# Patient Record
Sex: Male | Born: 1975 | Race: White | Hispanic: No | Marital: Married | Smoking: Current some day smoker
Health system: Southern US, Community
[De-identification: ages and names within clinical notes are randomized; demographics above are authoritative.]

## PROBLEM LIST (undated history)

## (undated) HISTORY — PX: CORNEAL TRANSPLANT: SHX108

---

## 2013-03-19 DIAGNOSIS — Z947 Corneal transplant status: Secondary | ICD-10-CM | POA: Insufficient documentation

## 2014-08-13 ENCOUNTER — Other Ambulatory Visit: Payer: Self-pay | Admitting: Internal Medicine

## 2014-08-13 DIAGNOSIS — M5412 Radiculopathy, cervical region: Secondary | ICD-10-CM

## 2014-08-17 ENCOUNTER — Ambulatory Visit
Admission: RE | Admit: 2014-08-17 | Discharge: 2014-08-17 | Disposition: A | Discharge status: Home / Self Care | Payer: 59 | Source: Ambulatory Visit | Attending: Internal Medicine | Admitting: Internal Medicine

## 2014-08-17 DIAGNOSIS — M5412 Radiculopathy, cervical region: Secondary | ICD-10-CM

## 2015-12-10 ENCOUNTER — Ambulatory Visit: Payer: Self-pay

## 2015-12-10 ENCOUNTER — Other Ambulatory Visit: Payer: Self-pay | Admitting: Occupational Medicine

## 2015-12-10 DIAGNOSIS — Z0000 Encounter for general adult medical examination without abnormal findings: Secondary | ICD-10-CM

## 2016-09-05 IMAGING — MR MR CERVICAL SPINE W/O CM
4 of 5 series · 19 of 48 positions shown · non-contrast
Comparison: None.

CLINICAL DATA: Cervical radiculopathy. Pain weakness and numbness
right arm

EXAM:
MRI CERVICAL SPINE WITHOUT CONTRAST
TECHNIQUE: Multiplanar, multisequence MR imaging of the cervical spine was
performed. No intravenous contrast was administered.

[Series 2: T2 · sagittal · 3.0mm · 0.39mm/px · 7 of 12 slices shown (1 of 2)]
[im 1/12]
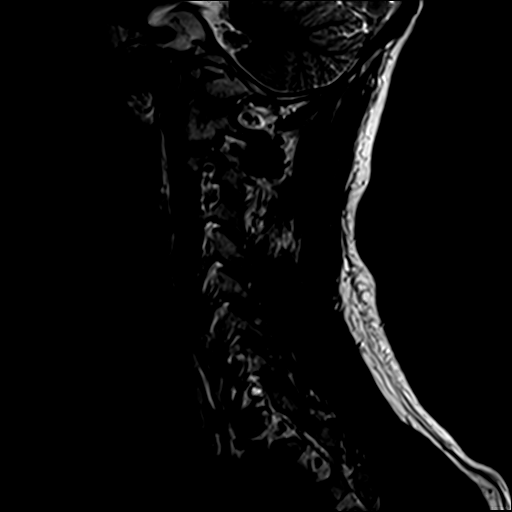
[im 2/12]
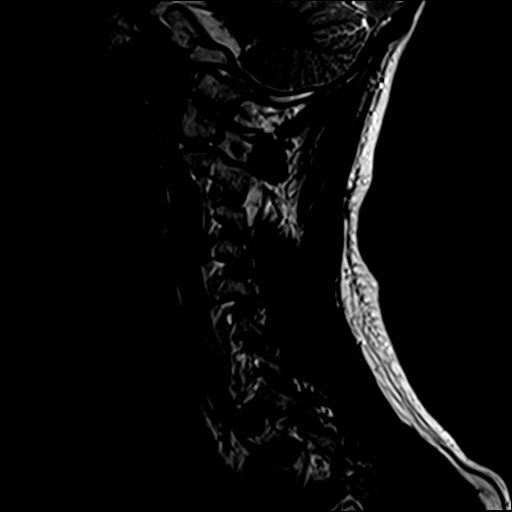
[im 4/12]
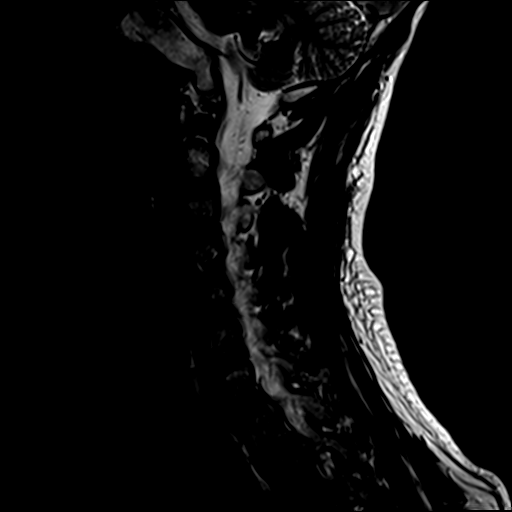
[im 6/12]
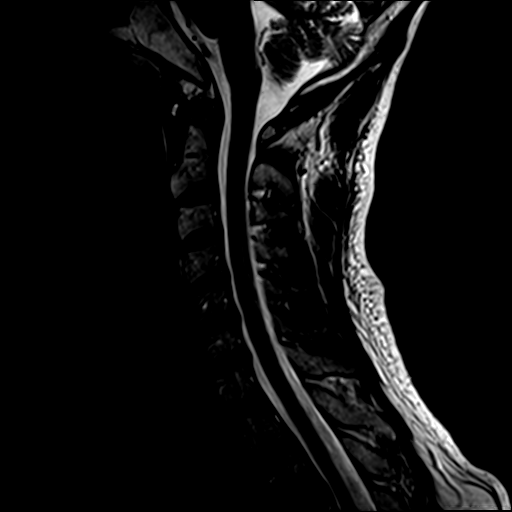
[im 8/12]
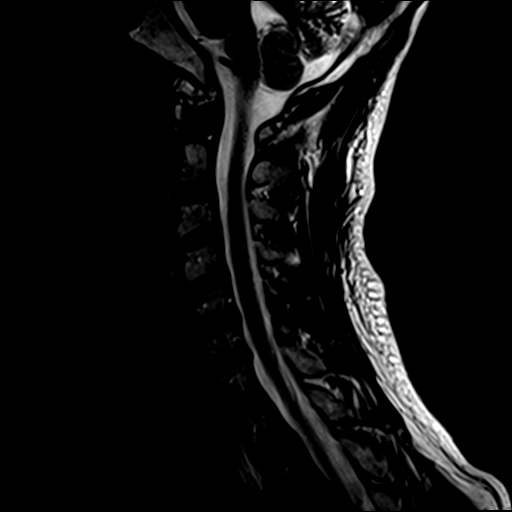
[im 10/12]
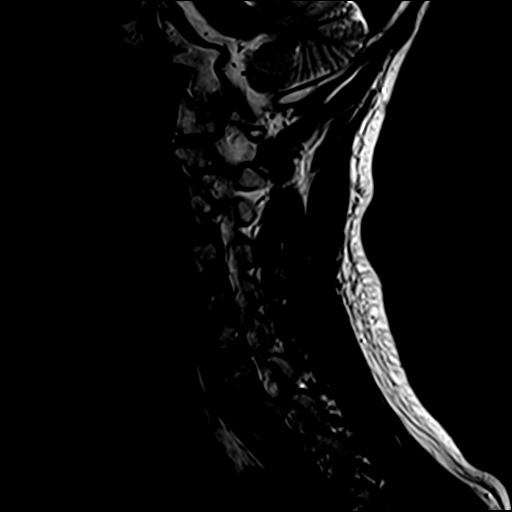
[im 12/12]
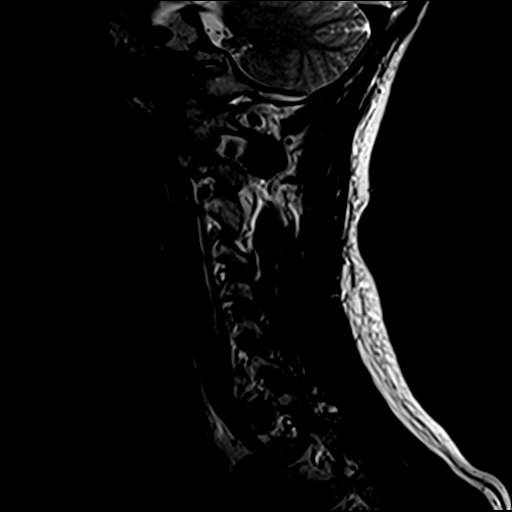

[Series 3: T1 · sagittal · 3.0mm · 0.39mm/px · 3 of 12 slices shown]
[im 2/12]
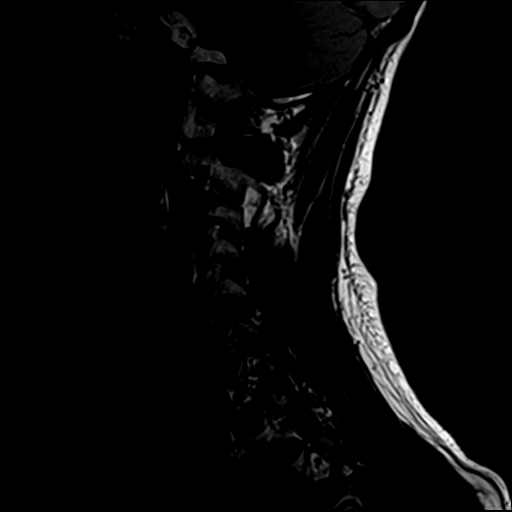
[im 6/12]
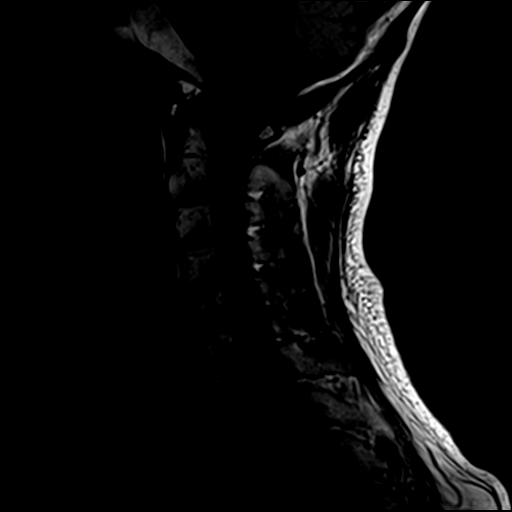
[im 10/12]
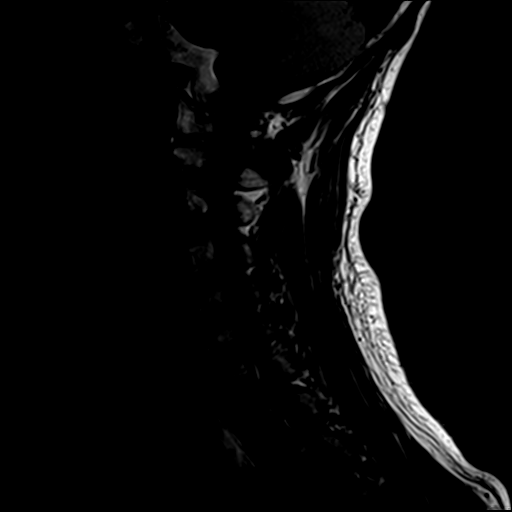

[Series 4: T2 · axial · 3.0mm · 0.33mm/px · z∈[-92,-10]mm · 6 of 27 slices shown (2 of 2)]
[im 1/27]
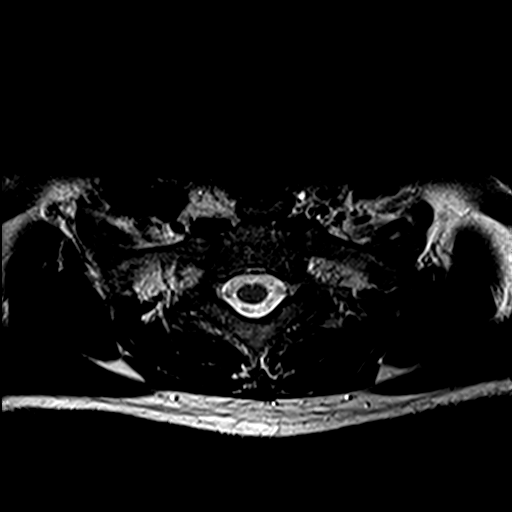
[im 5/27]
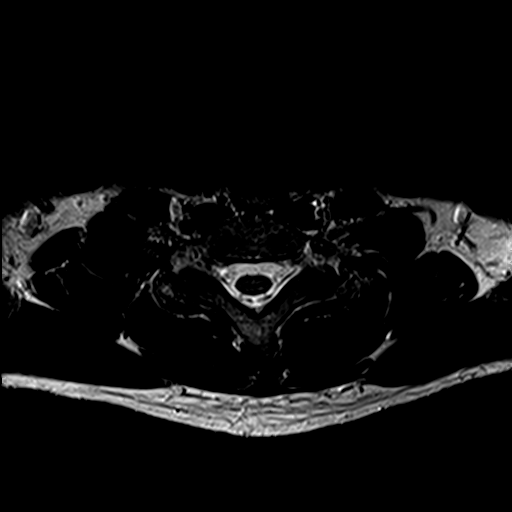
[im 9/27]
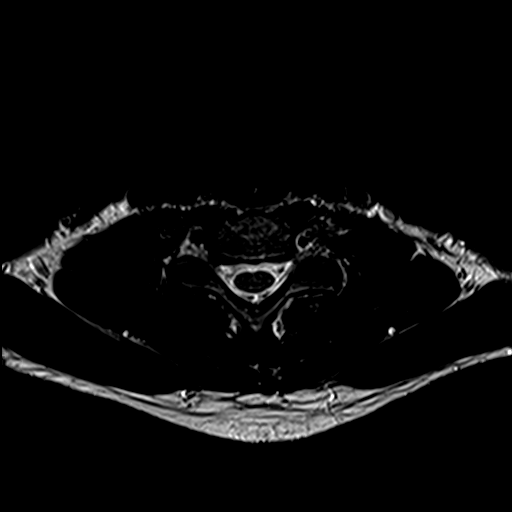
[im 13/27]
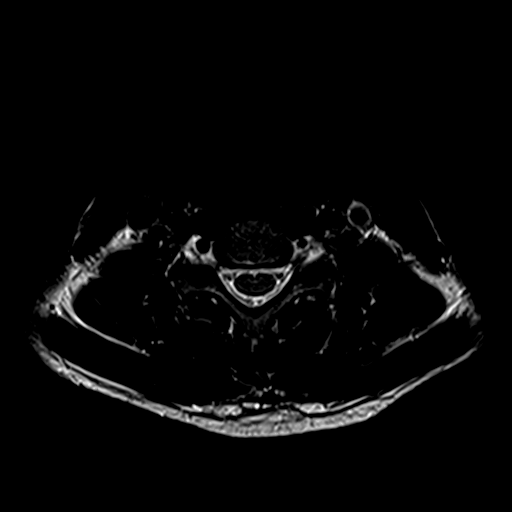
[im 15/27]
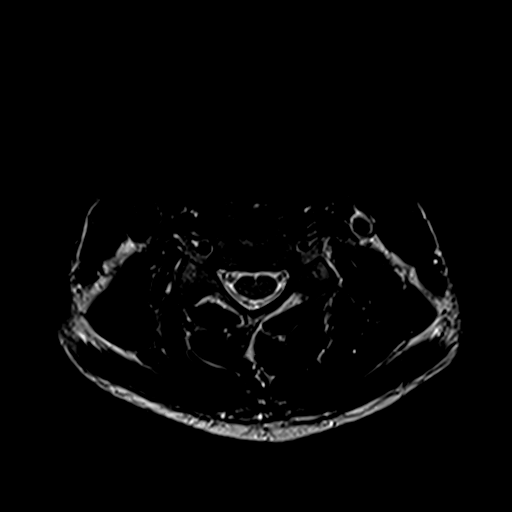
[im 23/27]
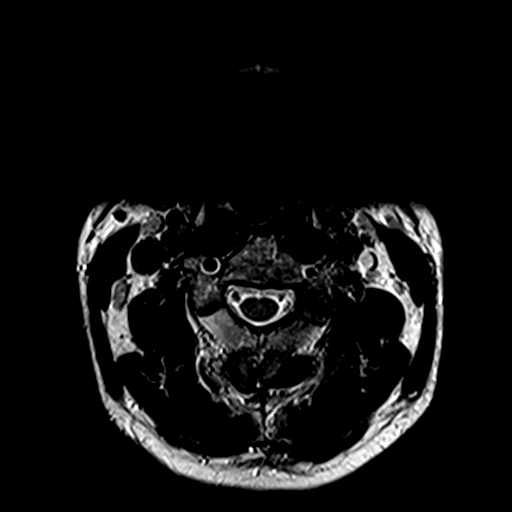

[Series 5: STIR · sagittal · 3.0mm · 0.39mm/px · 3 of 12 slices shown]
[im 3/12]
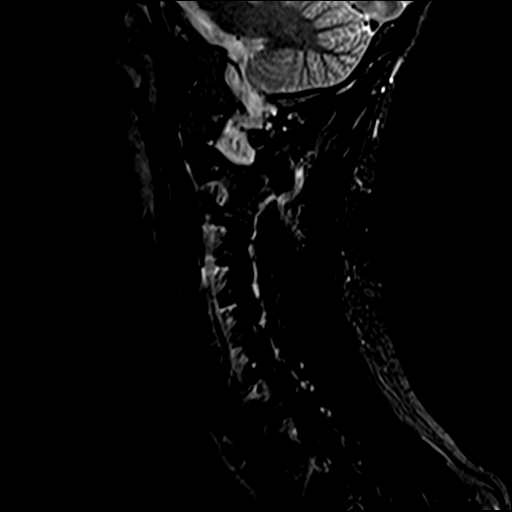
[im 7/12]
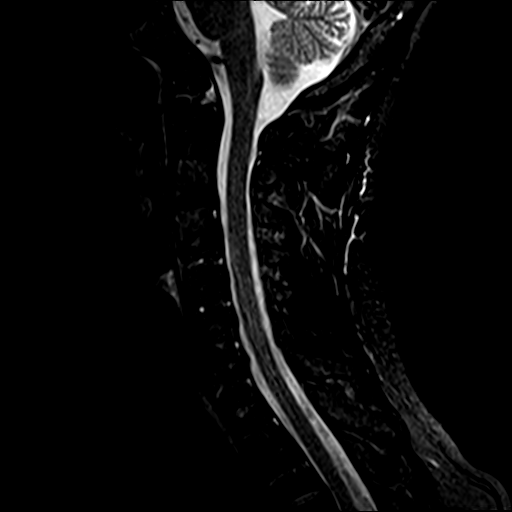
[im 12/12]
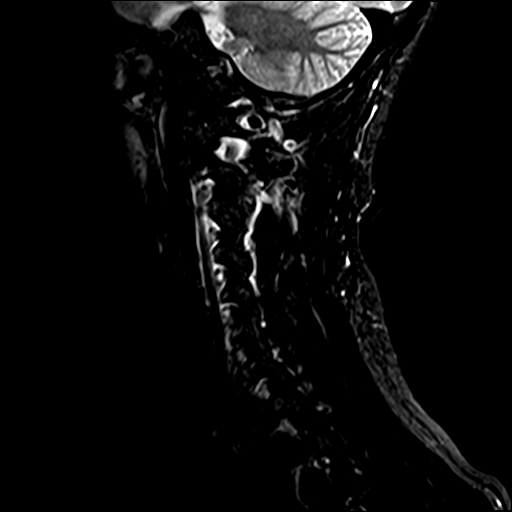

[19 of 48 positions shown; findings below may reference images not displayed]

FINDINGS: Normal cervical alignment. Negative for fracture or mass. Spinal
cord signal is normal. No cord lesion or cord compression.
Craniocervical junction is normal.

C2-3:  Negative

C3-4:  Negative

C4-5:  Negative

C5-6: Mild disc and mild facet degeneration. Mild foraminal
narrowing bilaterally.

C6-7: Disc degeneration with disc bulging and mild uncinate
spurring. Right foraminal encroachment due to disc and osteophyte
with compression of the right C7 nerve root. Mild facet hypertrophy.
Spinal canal normal in size

C7-T1: Negative
IMPRESSION: Mild foraminal stenosis bilaterally at C5-6 due to spurring

Significant right foraminal encroachment C6-7 due to disc protrusion
and osteophyte with C7 nerve root impingement.

## 2022-03-02 ENCOUNTER — Ambulatory Visit
Admission: RE | Admit: 2022-03-02 | Discharge: 2022-03-02 | Disposition: A | Discharge status: Home / Self Care | Payer: Self-pay | Source: Ambulatory Visit | Attending: Family Medicine | Admitting: Family Medicine

## 2022-03-02 ENCOUNTER — Other Ambulatory Visit: Payer: Self-pay | Admitting: Family Medicine

## 2022-03-02 DIAGNOSIS — Z0289 Encounter for other administrative examinations: Secondary | ICD-10-CM

## 2022-03-25 ENCOUNTER — Ambulatory Visit: Payer: 59 | Admitting: Family Medicine

## 2022-03-25 ENCOUNTER — Encounter: Payer: Self-pay | Admitting: Family Medicine

## 2022-03-25 VITALS — BP 117/71 | HR 76 | Temp 97.6°F | Ht 69.0 in | Wt 186.2 lb

## 2022-03-25 DIAGNOSIS — F411 Generalized anxiety disorder: Secondary | ICD-10-CM

## 2022-03-25 DIAGNOSIS — E782 Mixed hyperlipidemia: Secondary | ICD-10-CM | POA: Diagnosis not present

## 2022-03-25 DIAGNOSIS — Z23 Encounter for immunization: Secondary | ICD-10-CM

## 2022-03-25 MED ORDER — BUSPIRONE HCL 10 MG PO TABS: 10.0000 mg | ORAL_TABLET | Freq: Two times a day (BID) | ORAL | 2 refills | Status: DC

## 2022-03-25 MED ORDER — PREDNISOLONE ACETATE 1 % OP SUSP: 1.0000 [drp] | Freq: Every day | OPHTHALMIC | 11 refills | Status: DC

## 2022-03-25 NOTE — Patient Instructions (Signed)
Insomnia Insomnia is a sleep disorder that makes it difficult to fall asleep or stay asleep. Insomnia can cause fatigue, low energy, difficulty concentrating, mood swings, and poor performance at work or school. There are three different ways to classify insomnia: Difficulty falling asleep. Difficulty staying asleep. Waking up too early in the morning. Any type of insomnia can be long-term (chronic) or short-term (acute). Both are common. Short-term insomnia usually lasts for 3 months or less. Chronic insomnia occurs at least three times a week for longer than 3 months. What are the causes? Insomnia may be caused by another condition, situation, or substance, such as: Having certain mental health conditions, such as anxiety and depression. Using caffeine, alcohol, tobacco, or drugs. Having gastrointestinal conditions, such as gastroesophageal reflux disease (GERD). Having certain medical conditions. These include: Asthma. Alzheimer's disease. Stroke. Chronic pain. An overactive thyroid gland (hyperthyroidism). Other sleep disorders, such as restless legs syndrome and sleep apnea. Menopause. Sometimes, the cause of insomnia may not be known. What increases the risk? Risk factors for insomnia include: Gender. Females are affected more often than males. Age. Insomnia is more common as people get older. Stress and certain medical and mental health conditions. Lack of exercise. Having an irregular work schedule. This may include working night shifts and traveling between different time zones. What are the signs or symptoms? If you have insomnia, the main symptom is having trouble falling asleep or having trouble staying asleep. This may lead to other symptoms, such as: Feeling tired or having low energy. Feeling nervous about going to sleep. Not feeling rested in the morning. Having trouble concentrating. Feeling irritable, anxious, or depressed. How is this diagnosed? This condition  may be diagnosed based on: Your symptoms and medical history. Your health care provider may ask about: Your sleep habits. Any medical conditions you have. Your mental health. A physical exam. How is this treated? Treatment for insomnia depends on the cause. Treatment may focus on treating an underlying condition that is causing the insomnia. Treatment may also include: Medicines to help you sleep. Counseling or therapy. Lifestyle adjustments to help you sleep better. Follow these instructions at home: Eating and drinking  Limit or avoid alcohol, caffeinated beverages, and products that contain nicotine and tobacco, especially close to bedtime. These can disrupt your sleep. Do not eat a large meal or eat spicy foods right before bedtime. This can lead to digestive discomfort that can make it hard for you to sleep. Sleep habits  Keep a sleep diary to help you and your health care provider figure out what could be causing your insomnia. Write down: When you sleep. When you wake up during the night. How well you sleep and how rested you feel the next day. Any side effects of medicines you are taking. What you eat and drink. Make your bedroom a dark, comfortable place where it is easy to fall asleep. Put up shades or blackout curtains to block light from outside. Use a white noise machine to block noise. Keep the temperature cool. Limit screen use before bedtime. This includes: Not watching TV. Not using your smartphone, tablet, or computer. Stick to a routine that includes going to bed and waking up at the same times every day and night. This can help you fall asleep faster. Consider making a quiet activity, such as reading, part of your nighttime routine. Try to avoid taking naps during the day so that you sleep better at night. Get out of bed if you are still awake after   15 minutes of trying to sleep. Keep the lights down, but try reading or doing a quiet activity. When you feel  sleepy, go back to bed. General instructions Take over-the-counter and prescription medicines only as told by your health care provider. Exercise regularly as told by your health care provider. However, avoid exercising in the hours right before bedtime. Use relaxation techniques to manage stress. Ask your health care provider to suggest some techniques that may work well for you. These may include: Breathing exercises. Routines to release muscle tension. Visualizing peaceful scenes. Make sure that you drive carefully. Do not drive if you feel very sleepy. Keep all follow-up visits. This is important. Contact a health care provider if: You are tired throughout the day. You have trouble in your daily routine due to sleepiness. You continue to have sleep problems, or your sleep problems get worse. Get help right away if: You have thoughts about hurting yourself or someone else. Get help right away if you feel like you may hurt yourself or others, or have thoughts about taking your own life. Go to your nearest emergency room or: Call 911. Call the National Suicide Prevention Lifeline at 1-800-273-8255 or 988. This is open 24 hours a day. Text the Crisis Text Line at 741741. Summary Insomnia is a sleep disorder that makes it difficult to fall asleep or stay asleep. Insomnia can be long-term (chronic) or short-term (acute). Treatment for insomnia depends on the cause. Treatment may focus on treating an underlying condition that is causing the insomnia. Keep a sleep diary to help you and your health care provider figure out what could be causing your insomnia. This information is not intended to replace advice given to you by your health care provider. Make sure you discuss any questions you have with your health care provider. Document Revised: 09/14/2021 Document Reviewed: 09/14/2021 Elsevier Patient Education  2023 [employer]  

## 2022-03-25 NOTE — Progress Notes (Unsigned)
Subjective:  Patient ID: Howard Oliver, male    DOB: 1976/10/08  Age: 46 y.o. MRN: [id number]  CC: Establish Care   HPI Howard Oliver presents for New patient evaluation. Followed for lipid disorder and anxiety.     03/25/2022    1:15 PM 03/25/2022    1:14 PM  GAD 7 : Generalized Anxiety Score  Nervous, Anxious, on Edge 2 1  Control/stop worrying 1 1  Worry too much - different things 2 0  Trouble relaxing 1 1  Restless 0 1  Easily annoyed or irritable 0 1  Afraid - awful might happen 0 0  Total GAD 7 Score 6 5  Anxiety Difficulty Not difficult at all Not difficult at all         03/25/2022    1:14 PM  Depression screen PHQ 2/9  Decreased Interest 0  Down, Depressed, Hopeless 0  PHQ - 2 Score 0  Altered sleeping 2  Tired, decreased energy 1  Change in appetite 0  Feeling bad or failure about yourself  0  Trouble concentrating 0  Moving slowly or fidgety/restless 0  Suicidal thoughts 0  PHQ-9 Score 3  Difficult doing work/chores Not difficult at all  Severe anxiety. Has gotten better. Still not sleeping well. Gets a knot in the stomach and starts sweating. Worries a lot. Works at [employer] and [employer]. Takes buspar.5 mg at night. Daytime use made him drowsy.   HAd corneal transplant for Keratoconus. OS, 10 years ago. Uses prednisolone 1% qhs  History Howard Oliver has no past medical history on file.   He has a past surgical history that includes Corneal transplant (Left).   His family history includes Cancer in his father and mother.He reports that he has been smoking e-cigarettes. He does not have any smokeless tobacco history on file. He reports current alcohol use of about 2.0 standard drinks of alcohol per week. He reports that he does not currently use drugs.    ROS Review of Systems  Constitutional: Negative.   HENT: Negative.    Eyes:  Negative for visual disturbance.  Respiratory:  Negative for cough and shortness of breath.   Cardiovascular:   Negative for chest pain and leg swelling.  Gastrointestinal:  Negative for abdominal pain, diarrhea, nausea and vomiting.  Genitourinary:  Negative for difficulty urinating.  Musculoskeletal:  Negative for arthralgias and myalgias.  Skin:  Negative for rash.  Neurological:  Negative for headaches.  Psychiatric/Behavioral:  Negative for sleep disturbance.     Objective:  BP 117/71   Pulse 76   Temp 97.6 F (36.4 C)   Ht 5\' 9"  (1.753 m)   Wt 186 lb 3.2 oz (84.5 kg)   SpO2 97%   BMI 27.50 kg/m   BP Readings from Last 3 Encounters:  03/25/22 117/71    Wt Readings from Last 3 Encounters:  03/25/22 186 lb 3.2 oz (84.5 kg)     Physical Exam Vitals reviewed.  Constitutional:      General: He is not in acute distress.    Appearance: He is well-developed.  HENT:     Head: Normocephalic and atraumatic.     Right Ear: External ear normal.     Left Ear: External ear normal.     Nose: Nose normal.     Mouth/Throat:     Pharynx: No oropharyngeal exudate or posterior oropharyngeal erythema.  Eyes:     Conjunctiva/sclera: Conjunctivae normal.     Pupils: Pupils are equal, round, and reactive  to light.  Cardiovascular:     Rate and Rhythm: Normal rate and regular rhythm.     Heart sounds: Normal heart sounds. No murmur heard. Pulmonary:     Effort: Pulmonary effort is normal. No respiratory distress.     Breath sounds: Normal breath sounds. No wheezing or rales.  Abdominal:     Palpations: Abdomen is soft.     Tenderness: There is no abdominal tenderness.  Musculoskeletal:        General: Normal range of motion.     Cervical back: Normal range of motion and neck supple.  Skin:    General: Skin is warm and dry.  Neurological:     Mental Status: He is alert and oriented to person, place, and time.     Deep Tendon Reflexes: Reflexes are normal and symmetric.  Psychiatric:        Behavior: Behavior normal.        Thought Content: Thought content normal.        Judgment:  Judgment normal.       Assessment & Plan:   Piers was seen today for establish care.  Diagnoses and all orders for this visit:  Mixed hyperlipidemia  GAD (generalized anxiety disorder)  Other orders -     prednisoLONE acetate (PRED FORTE) 1 % ophthalmic suspension; Place 1 drop into the left eye at bedtime. -     busPIRone (BUSPAR) 10 MG tablet; Take 1 tablet (10 mg total) by mouth 2 (two) times daily. -     Td : Tetanus/diphtheria >7yo Preservative  free    I am having Howard Oliver start on prednisoLONE acetate and busPIRone.  Allergies as of 03/25/2022   Not on File      Medication List        Accurate as of March 25, 2022 11:59 PM. If you have any questions, ask your nurse or doctor.          busPIRone 10 MG tablet Commonly known as: BUSPAR Take 1 tablet (10 mg total) by mouth 2 (two) times daily. Started by: Mechele Claude, MD   prednisoLONE acetate 1 % ophthalmic suspension Commonly known as: PRED FORTE Place 1 drop into the left eye at bedtime. Started by: Mechele Claude, MD         Follow-up: Return in about 6 months (around 09/24/2022) for Anxiety, cholesterol.  Mechele Claude, M.D.

## 2022-03-28 ENCOUNTER — Encounter: Payer: Self-pay | Admitting: Family Medicine

## 2022-04-16 ENCOUNTER — Other Ambulatory Visit: Payer: Self-pay | Admitting: Family Medicine

## 2022-07-28 ENCOUNTER — Other Ambulatory Visit: Payer: Self-pay | Admitting: Family Medicine

## 2022-08-25 ENCOUNTER — Encounter: Payer: Self-pay | Admitting: Family Medicine

## 2022-08-25 ENCOUNTER — Ambulatory Visit: Payer: 59 | Admitting: Family Medicine

## 2022-08-25 VITALS — BP 116/67 | HR 64 | Temp 97.2°F | Ht 69.0 in | Wt 186.4 lb

## 2022-08-25 DIAGNOSIS — N410 Acute prostatitis: Secondary | ICD-10-CM

## 2022-08-25 DIAGNOSIS — H18602 Keratoconus, unspecified, left eye: Secondary | ICD-10-CM | POA: Insufficient documentation

## 2022-08-25 LAB — URINALYSIS, COMPLETE
Bilirubin, UA: NEGATIVE
Glucose, UA: NEGATIVE
Ketones, UA: NEGATIVE
Leukocytes,UA: NEGATIVE
Nitrite, UA: NEGATIVE
Protein,UA: NEGATIVE
RBC, UA: NEGATIVE
Specific Gravity, UA: 1.025 (ref 1.005–1.030)
Urobilinogen, Ur: 0.2 mg/dL (ref 0.2–1.0)
pH, UA: 5.5 (ref 5.0–7.5)

## 2022-08-25 LAB — MICROSCOPIC EXAMINATION
RBC, Urine: NONE SEEN /hpf (ref 0–2)
Renal Epithel, UA: NONE SEEN /hpf
WBC, UA: NONE SEEN /hpf (ref 0–5)

## 2022-08-25 MED ORDER — CIPROFLOXACIN HCL 500 MG PO TABS: 500.0000 mg | ORAL_TABLET | Freq: Two times a day (BID) | ORAL | 0 refills | Status: DC

## 2022-08-25 NOTE — Progress Notes (Signed)
Chief Complaint  Patient presents with   Urinary Frequency    HPI  Patient presents today for urinary frequency. No dysuria. Onset 4-5 days ago. No urgency. Testicular discomfort. Some low back pain, but has been moving. No nausea, no fever  PMH: Smoking status noted ROS: Per HPI  Objective: BP 116/67   Pulse 64   Temp (!) 97.2 F (36.2 C)   Ht 5\' 9"  (1.753 m)   Wt 186 lb 6.4 oz (84.6 kg)   SpO2 97%   BMI 27.53 kg/m  Gen: NAD, alert, cooperative with exam HEENT: NCAT,  Abd: SNTND, prostate supple, boggy Ext: No edema, warm Neuro: Alert and oriented, No gross deficits  Assessment and plan:  1. Acute prostatitis     Meds ordered this encounter  Medications   ciprofloxacin (CIPRO) 500 MG tablet    Sig: Take 1 tablet (500 mg total) by mouth 2 (two) times daily. For prostate. Take all of these.    Dispense:  60 tablet    Refill:  0    Orders Placed This Encounter  Procedures   Urine Culture   Urinalysis, Complete    Follow up as needed.  , MD

## 2022-08-25 NOTE — Assessment & Plan Note (Signed)
Corneal transplant, uses steroid drops daily

## 2022-08-27 LAB — URINE CULTURE: Organism ID, Bacteria: NO GROWTH

## 2022-09-23 ENCOUNTER — Ambulatory Visit: Payer: 59 | Admitting: Family Medicine

## 2022-10-07 ENCOUNTER — Encounter: Payer: Self-pay | Admitting: Family Medicine

## 2022-10-07 ENCOUNTER — Ambulatory Visit: Payer: 59 | Admitting: Family Medicine

## 2022-10-07 VITALS — BP 113/62 | HR 63 | Temp 97.6°F | Ht 69.0 in | Wt 186.6 lb

## 2022-10-07 DIAGNOSIS — N410 Acute prostatitis: Secondary | ICD-10-CM | POA: Diagnosis not present

## 2022-10-07 DIAGNOSIS — M5441 Lumbago with sciatica, right side: Secondary | ICD-10-CM

## 2022-10-07 DIAGNOSIS — M5442 Lumbago with sciatica, left side: Secondary | ICD-10-CM | POA: Diagnosis not present

## 2022-10-07 MED ORDER — CIPROFLOXACIN HCL 500 MG PO TABS: 500.0000 mg | ORAL_TABLET | Freq: Two times a day (BID) | ORAL | 0 refills | Status: DC

## 2022-10-07 MED ORDER — BUSPIRONE HCL 10 MG PO TABS: 10.0000 mg | ORAL_TABLET | Freq: Two times a day (BID) | ORAL | 3 refills | Status: AC

## 2022-10-07 MED ORDER — DICLOFENAC SODIUM 75 MG PO TBEC: 75.0000 mg | DELAYED_RELEASE_TABLET | Freq: Two times a day (BID) | ORAL | 2 refills | Status: DC

## 2022-10-07 MED ORDER — PREDNISONE 10 MG PO TABS: ORAL_TABLET | ORAL | 0 refills | Status: DC

## 2022-10-07 NOTE — Progress Notes (Signed)
Subjective:  Patient ID: Howard Oliver, male    DOB: 08-20-1976  Age: 46 y.o. MRN: [id number]  CC: Medical Management of Chronic Issues   HPI Howard Oliver presents for frequent urination some days 3-4 times a day, other days 6-7 times . Up once at night. with pressure pain at groin. No urgency. Decrease of testicular discomfort. Pain at lower back and down posterior thighs and buttocks     10/07/2022    8:21 AM 08/25/2022    8:34 AM 03/25/2022    1:14 PM  Depression screen PHQ 2/9  Decreased Interest 0 0 0  Down, Depressed, Hopeless 0 0 0  PHQ - 2 Score 0 0 0  Altered sleeping 2  2  Tired, decreased energy 1  1  Change in appetite 0  0  Feeling bad or failure about yourself  0  0  Trouble concentrating 0  0  Moving slowly or fidgety/restless 0  0  Suicidal thoughts 0  0  PHQ-9 Score 3  3  Difficult doing work/chores Not difficult at all  Not difficult at all    History Howard Oliver has no past medical history on file.   Howard Oliver has a past surgical history that includes Corneal transplant (Left).   His family history includes Cancer in his father and mother.Howard Oliver reports that Howard Oliver has been smoking e-cigarettes. Howard Oliver does not have any smokeless tobacco history on file. Howard Oliver reports current alcohol use of about 2.0 standard drinks of alcohol per week. Howard Oliver reports that Howard Oliver does not currently use drugs.    ROS Review of Systems  Constitutional:  Negative for chills, diaphoresis and fever.  HENT:  Negative for sore throat.   Respiratory:  Negative for shortness of breath.   Cardiovascular:  Negative for chest pain.  Gastrointestinal:  Negative for abdominal pain.  Musculoskeletal:  Positive for back pain (radiates bilat. to buttocks & thighs) and myalgias. Negative for neck pain.  Skin:  Negative for rash.  Neurological:  Negative for numbness.    Objective:  BP 113/62   Pulse 63   Temp 97.6 F (36.4 C)   Ht 5\' 9"  (1.753 m)   Wt 186 lb 9.6 oz (84.6 kg)   SpO2 97%   BMI 27.56 kg/m    BP Readings from Last 3 Encounters:  10/07/22 113/62  08/25/22 116/67  03/25/22 117/71    Wt Readings from Last 3 Encounters:  10/07/22 186 lb 9.6 oz (84.6 kg)  08/25/22 186 lb 6.4 oz (84.6 kg)  03/25/22 186 lb 3.2 oz (84.5 kg)     Physical Exam Vitals reviewed.  Constitutional:      Appearance: Howard Oliver is well-developed.  HENT:     Head: Normocephalic and atraumatic.     Right Ear: External ear normal.     Left Ear: External ear normal.     Mouth/Throat:     Pharynx: No oropharyngeal exudate or posterior oropharyngeal erythema.  Eyes:     Pupils: Pupils are equal, round, and reactive to light.  Cardiovascular:     Rate and Rhythm: Normal rate and regular rhythm.     Heart sounds: No murmur heard. Pulmonary:     Effort: No respiratory distress.     Breath sounds: Normal breath sounds.  Genitourinary:    Comments: Prostate boggy, no masses Musculoskeletal:        General: Tenderness (mild at region of L4-5 bilaterally) present.     Cervical back: Normal range of motion and neck supple.  Neurological:  Mental Status: Howard Oliver is alert and oriented to person, place, and time.       Assessment & Plan:   Howard Oliver was seen today for medical management of chronic issues.  Diagnoses and all orders for this visit:  Acute prostatitis  Acute bilateral low back pain with bilateral sciatica  Other orders -     busPIRone (BUSPAR) 10 MG tablet; Take 1 tablet (10 mg total) by mouth 2 (two) times daily. -     diclofenac (VOLTAREN) 75 MG EC tablet; Take 1 tablet (75 mg total) by mouth 2 (two) times daily. For muscle and  Joint pain -     predniSONE (DELTASONE) 10 MG tablet; Take 5 daily for 2 days followed by 4,3,2 and 1 for 2 days each. -     ciprofloxacin (CIPRO) 500 MG tablet; Take 1 tablet (500 mg total) by mouth 2 (two) times daily. For prostate. Take all of these.       I have changed Howard Oliver's busPIRone. I am also having him start on diclofenac and predniSONE.  Additionally, I am having him maintain his prednisoLONE acetate and ciprofloxacin.  Allergies as of 10/07/2022   No Known Allergies      Medication List        Accurate as of October 07, 2022  8:59 AM. If you have any questions, ask your nurse or doctor.          busPIRone 10 MG tablet Commonly known as: BUSPAR Take 1 tablet (10 mg total) by mouth 2 (two) times daily.   ciprofloxacin 500 MG tablet Commonly known as: Cipro Take 1 tablet (500 mg total) by mouth 2 (two) times daily. For prostate. Take all of these.   diclofenac 75 MG EC tablet Commonly known as: VOLTAREN Take 1 tablet (75 mg total) by mouth 2 (two) times daily. For muscle and  Joint pain Started by: Mechele Claude, MD   prednisoLONE acetate 1 % ophthalmic suspension Commonly known as: PRED FORTE Place 1 drop into the left eye at bedtime.   predniSONE 10 MG tablet Commonly known as: DELTASONE Take 5 daily for 2 days followed by 4,3,2 and 1 for 2 days each. Started by: Mechele Claude, MD         Follow-up: If not better in 1 month will need to see urology. Pt. Will call to let me know.  Mechele Claude, M.D.

## 2022-10-14 ENCOUNTER — Telehealth: Payer: Self-pay | Admitting: Family Medicine

## 2022-10-14 DIAGNOSIS — R3989 Other symptoms and signs involving the genitourinary system: Secondary | ICD-10-CM

## 2022-10-14 DIAGNOSIS — R399 Unspecified symptoms and signs involving the genitourinary system: Secondary | ICD-10-CM

## 2022-10-14 NOTE — Telephone Encounter (Signed)
Pt called to let Dr Darlyn Read know that his symptoms are still no better after taking 2 antibiotics and wants Dr Darlyn Read to go ahead and place referral for him to see Urologist.

## 2022-10-14 NOTE — Telephone Encounter (Signed)
Referral placed.

## 2022-11-16 ENCOUNTER — Encounter: Payer: Self-pay | Admitting: Urology

## 2022-11-16 ENCOUNTER — Ambulatory Visit: Payer: 59 | Admitting: Urology

## 2022-11-16 VITALS — BP 136/87 | HR 72 | Ht 69.0 in | Wt 186.0 lb

## 2022-11-16 DIAGNOSIS — N411 Chronic prostatitis: Secondary | ICD-10-CM | POA: Diagnosis not present

## 2022-11-16 DIAGNOSIS — N138 Other obstructive and reflux uropathy: Secondary | ICD-10-CM

## 2022-11-16 DIAGNOSIS — R350 Frequency of micturition: Secondary | ICD-10-CM | POA: Diagnosis not present

## 2022-11-16 DIAGNOSIS — N401 Enlarged prostate with lower urinary tract symptoms: Secondary | ICD-10-CM

## 2022-11-16 LAB — BLADDER SCAN AMB NON-IMAGING: Scan Result: 0

## 2022-11-16 MED ORDER — SULFAMETHOXAZOLE-TRIMETHOPRIM 800-160 MG PO TABS: 1.0000 | ORAL_TABLET | Freq: Two times a day (BID) | ORAL | 0 refills | Status: DC

## 2022-11-16 MED ORDER — TAMSULOSIN HCL 0.4 MG PO CAPS: 0.4000 mg | ORAL_CAPSULE | Freq: Every day | ORAL | 11 refills | Status: DC

## 2022-11-16 NOTE — Progress Notes (Signed)
post void residual = 0 ml

## 2022-11-16 NOTE — Patient Instructions (Signed)

## 2022-11-16 NOTE — Progress Notes (Signed)
11/16/2022 3:13 PM   Howard Oliver 10-28-1975 [id number]  Referring provider: Gwenlyn Oliver, [place],  [place] [address]  Urinary frequency   HPI: Howard Oliver is a 47yo here for evaluation for urinary frequency. He was diagnosed with prostatitis 2 months ago and was treated for 2 months with cipro. IPSS 14 QOL 5 on no BPh therapy. He notes slight improvement on the antibiotics. He has intermittent dysuria. His urine frequency is every 2 hours. Urine stream is strong but he does have urinary hesitancy. All of his LUTS started 2 months ago.    PMH: No past medical history on file.  Surgical History: Past Surgical History:  Procedure Laterality Date   CORNEAL TRANSPLANT Left     Home Medications:  Allergies as of 11/16/2022   No Known Allergies      Medication List        Accurate as of November 16, 2022  3:13 PM. If you have any questions, ask your nurse or doctor.          busPIRone 10 MG tablet Commonly known as: BUSPAR Take 1 tablet (10 mg total) by mouth 2 (two) times daily.   ciprofloxacin 500 MG tablet Commonly known as: Cipro Take 1 tablet (500 mg total) by mouth 2 (two) times daily. For prostate. Take all of these.   diclofenac 75 MG EC tablet Commonly known as: VOLTAREN Take 1 tablet (75 mg total) by mouth 2 (two) times daily. For muscle and  Joint pain   prednisoLONE acetate 1 % ophthalmic suspension Commonly known as: PRED FORTE Place 1 drop into the left eye at bedtime.   predniSONE 10 MG tablet Commonly known as: DELTASONE Take 5 daily for 2 days followed by 4,3,2 and 1 for 2 days each.        Allergies: No Known Allergies  Family History: Family History  Problem Relation Age of Onset   Cancer Mother    Cancer Father     Social History:  reports that he has been smoking e-cigarettes. He does not have any smokeless tobacco history on file. He reports current alcohol use of about 2.0 standard drinks of alcohol per  week. He reports that he does not currently use drugs.  ROS: All other review of systems were reviewed and are negative except what is noted above in HPI  Physical Exam: BP 136/87   Pulse 72   Ht 5\' 9"  (1.753 m)   Wt 186 lb (84.4 kg)   BMI 27.47 kg/m   Constitutional:  Alert and oriented, No acute distress. HEENT: [place] AT, moist mucus membranes.  Trachea midline, no masses. Cardiovascular: No clubbing, cyanosis, or edema. Respiratory: Normal respiratory effort, no increased work of breathing. GI: Abdomen is soft, nontender, nondistended, no abdominal masses GU: No CVA tenderness. Circumcised phallus. No masses/lesions on penis, testis, scrotum. Prostate 30g smooth no nodules no induration.  Lymph: No cervical or inguinal lymphadenopathy. Skin: No rashes, bruises or suspicious lesions. Neurologic: Grossly intact, no focal deficits, moving all 4 extremities. Psychiatric: Normal mood and affect.  Laboratory Data: No results found for: "WBC", "HGB", "HCT", "MCV", "PLT"  No results found for: "CREATININE"  No results found for: "PSA"  No results found for: "TESTOSTERONE"  No results found for: "HGBA1C"  Urinalysis    Component Value Date/Time   APPEARANCEUR Clear 08/25/2022 0834   GLUCOSEU Negative 08/25/2022 0834   BILIRUBINUR Negative 08/25/2022 0834   PROTEINUR Negative 08/25/2022 0834   NITRITE Negative  08/25/2022 0834   LEUKOCYTESUR Negative 08/25/2022 0834    Lab Results  Component Value Date   LABMICR See below: 08/25/2022   WBCUA None seen 08/25/2022   LABEPIT 0-10 08/25/2022   BACTERIA Few (A) 08/25/2022    Pertinent Imaging:  No results found for this or any previous visit.  No results found for this or any previous visit.  No results found for this or any previous visit.  No results found for this or any previous visit.  No results found for this or any previous visit.  No valid procedures specified. No results found for this or any previous  visit.  No results found for this or any previous visit.   Assessment & Plan:    1. BPH with obstruction/lower urinary tract symptoms -We will start flona x0.4mg  daily - Urinalysis, Routine w reflex microscopic - BLADDER SCAN AMB NON-IMAGING  2. 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 Urinary frequency -flomax 0.4mg  daily    No follow-ups on file.  Howard Bang, MD  [facility] [place]

## 2022-11-17 LAB — URINALYSIS, ROUTINE W REFLEX MICROSCOPIC
Bilirubin, UA: NEGATIVE
Glucose, UA: NEGATIVE
Ketones, UA: NEGATIVE
Leukocytes,UA: NEGATIVE
Nitrite, UA: NEGATIVE
Protein,UA: NEGATIVE
RBC, UA: NEGATIVE
Specific Gravity, UA: 1.025 (ref 1.005–1.030)
Urobilinogen, Ur: 0.2 mg/dL (ref 0.2–1.0)
pH, UA: 5.5 (ref 5.0–7.5)

## 2022-12-27 ENCOUNTER — Ambulatory Visit: Payer: 59 | Admitting: Urology

## 2022-12-27 VITALS — BP 120/67 | HR 61

## 2022-12-27 DIAGNOSIS — R350 Frequency of micturition: Secondary | ICD-10-CM | POA: Diagnosis not present

## 2022-12-27 DIAGNOSIS — N411 Chronic prostatitis: Secondary | ICD-10-CM | POA: Diagnosis not present

## 2022-12-27 DIAGNOSIS — N138 Other obstructive and reflux uropathy: Secondary | ICD-10-CM

## 2022-12-27 MED ORDER — DOXYCYCLINE HYCLATE 100 MG PO CAPS: 100.0000 mg | ORAL_CAPSULE | Freq: Two times a day (BID) | ORAL | 0 refills | Status: DC

## 2022-12-27 MED ORDER — MIRABEGRON ER 25 MG PO TB24: 25.0000 mg | ORAL_TABLET | Freq: Every day | ORAL | 0 refills | Status: DC

## 2022-12-27 NOTE — Progress Notes (Unsigned)
12/27/2022 4:13 PM   Howard Oliver Feb 15, 1976 [id number]  Referring provider: Claretta Fraise, MD [place],  [place] [address]  No chief complaint on file.   HPI:    PMH: No past medical history on file.  Surgical History: Past Surgical History:  Procedure Laterality Date   CORNEAL TRANSPLANT Left     Home Medications:  Allergies as of 12/27/2022   No Known Allergies      Medication List        Accurate as of December 27, 2022  4:13 PM. If you have any questions, ask your nurse or doctor.          busPIRone 10 MG tablet Commonly known as: BUSPAR Take 1 tablet (10 mg total) by mouth 2 (two) times daily.   ciprofloxacin 500 MG tablet Commonly known as: Cipro Take 1 tablet (500 mg total) by mouth 2 (two) times daily. For prostate. Take all of these.   diclofenac 75 MG EC tablet Commonly known as: VOLTAREN Take 1 tablet (75 mg total) by mouth 2 (two) times daily. For muscle and  Joint pain   prednisoLONE acetate 1 % ophthalmic suspension Commonly known as: PRED FORTE Place 1 drop into the left eye at bedtime.   predniSONE 10 MG tablet Commonly known as: DELTASONE Take 5 daily for 2 days followed by 4,3,2 and 1 for 2 days each.   sulfamethoxazole-trimethoprim 800-160 MG tablet Commonly known as: BACTRIM DS Take 1 tablet by mouth 2 (two) times daily.   tamsulosin 0.4 MG Caps capsule Commonly known as: FLOMAX Take 1 capsule (0.4 mg total) by mouth daily after supper.        Allergies: No Known Allergies  Family History: Family History  Problem Relation Age of Onset   Cancer Mother    Cancer Father     Social History:  reports that he has been smoking e-cigarettes. He does not have any smokeless tobacco history on file. He reports current alcohol use of about 2.0 standard drinks of alcohol per week. He reports that he does not currently use drugs.  ROS: All other review of systems were reviewed and are negative except what is noted  above in HPI  Physical Exam: BP 120/67   Pulse 61   Constitutional:  Alert and oriented, No acute distress. HEENT: [place] AT, moist mucus membranes.  Trachea midline, no masses. Cardiovascular: No clubbing, cyanosis, or edema. Respiratory: Normal respiratory effort, no increased work of breathing. GI: Abdomen is soft, nontender, nondistended, no abdominal masses GU: No CVA tenderness.  Lymph: No cervical or inguinal lymphadenopathy. Skin: No rashes, bruises or suspicious lesions. Neurologic: Grossly intact, no focal deficits, moving all 4 extremities. Psychiatric: Normal mood and affect.  Laboratory Data: No results found for: "WBC", "HGB", "HCT", "MCV", "PLT"  No results found for: "CREATININE"  No results found for: "PSA"  No results found for: "TESTOSTERONE"  No results found for: "HGBA1C"  Urinalysis    Component Value Date/Time   APPEARANCEUR Clear 11/16/2022 1424   GLUCOSEU Negative 11/16/2022 1424   BILIRUBINUR Negative 11/16/2022 1424   PROTEINUR Negative 11/16/2022 1424   NITRITE Negative 11/16/2022 1424   LEUKOCYTESUR Negative 11/16/2022 1424    Lab Results  Component Value Date   LABMICR Comment 11/16/2022   WBCUA None seen 08/25/2022   LABEPIT 0-10 08/25/2022   BACTERIA Few (A) 08/25/2022    Pertinent Imaging: *** No results found for this or any previous visit.  No results found for this or  any previous visit.  No results found for this or any previous visit.  No results found for this or any previous visit.  No results found for this or any previous visit.  No valid procedures specified. No results found for this or any previous visit.  No results found for this or any previous visit.   Assessment & Plan:    1. Urinary frequency -mirabegron '25mg'$  daily - Urinalysis, Routine w reflex microscopic  2. 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  Nicolette Bang, MD  [facility]  Urology [place]

## 2022-12-28 ENCOUNTER — Encounter: Payer: Self-pay | Admitting: Urology

## 2022-12-28 LAB — URINALYSIS, ROUTINE W REFLEX MICROSCOPIC
Bilirubin, UA: NEGATIVE
Glucose, UA: NEGATIVE
Ketones, UA: NEGATIVE
Leukocytes,UA: NEGATIVE
Nitrite, UA: NEGATIVE
Protein,UA: NEGATIVE
RBC, UA: NEGATIVE
Specific Gravity, UA: 1.03 (ref 1.005–1.030)
Urobilinogen, Ur: 0.2 mg/dL (ref 0.2–1.0)
pH, UA: 5.5 (ref 5.0–7.5)

## 2022-12-28 NOTE — Patient Instructions (Signed)

## 2022-12-30 ENCOUNTER — Ambulatory Visit: Payer: 59 | Admitting: Family Medicine

## 2022-12-30 ENCOUNTER — Encounter: Payer: Self-pay | Admitting: Family Medicine

## 2022-12-30 VITALS — BP 123/74 | HR 54 | Ht 69.0 in | Wt 191.0 lb

## 2022-12-30 DIAGNOSIS — J302 Other seasonal allergic rhinitis: Secondary | ICD-10-CM

## 2022-12-30 MED ORDER — FLUTICASONE PROPIONATE 50 MCG/ACT NA SUSP: 1.0000 | Freq: Two times a day (BID) | NASAL | 6 refills | Status: AC | PRN

## 2022-12-30 MED ORDER — CETIRIZINE HCL 10 MG PO TABS: 10.0000 mg | ORAL_TABLET | Freq: Every day | ORAL | 2 refills | Status: DC

## 2022-12-30 NOTE — Progress Notes (Signed)
BP 123/74   Pulse (!) 54   Ht '5\' 9"'$  (1.753 m)   Wt 191 lb (86.6 kg)   SpO2 98%   BMI 28.21 kg/m    Subjective:   Patient ID: Howard Oliver, male    DOB: [birth year], 47 y.o.   MRN: [id number]  HPI: Howard Oliver is a 47 y.o. male presenting on 12/30/2022 for Shortness of Breath and Sinus Problem   HPI Sinus congestion nasal congestion feeling short of breath because of that. He says he is using over the Claritin and not helped.  He is undergoing prostate issues treatment with the urologist and just started doxycycline 2 days ago.  He says that has not really helped his shortness of breath or congestion.  He says mostly he is short of breath because he gets so congested in the nasal region he has trouble catching his breath from that he cannot breathe through his nose.  He denies any fevers or chills or sick contacts.  Is been going on for 3 to 4 weeks just does not seem to be improving.  He does normally get allergies in the springtime.  Relevant past medical, surgical, family and social history reviewed and updated as indicated. Interim medical history since our last visit reviewed. Allergies and medications reviewed and updated.  Review of Systems  Constitutional:  Negative for chills and fever.  HENT:  Positive for congestion, postnasal drip, rhinorrhea, sinus pressure and sneezing. Negative for ear discharge, ear pain, sore throat and voice change.   Eyes:  Negative for pain, discharge, redness and visual disturbance.  Respiratory:  Positive for cough. Negative for shortness of breath and wheezing.   Cardiovascular:  Negative for chest pain and leg swelling.  Musculoskeletal:  Negative for back pain and gait problem.  Skin:  Negative for rash.  All other systems reviewed and are negative.   Per HPI unless specifically indicated above   Allergies as of 12/30/2022   No Known Allergies      Medication List        Accurate as of December 30, 2022  9:26 AM. If you have  any questions, ask your nurse or doctor.          STOP taking these medications    ciprofloxacin 500 MG tablet Commonly known as: Cipro Stopped by: Worthy Rancher, MD   diclofenac 75 MG EC tablet Commonly known as: VOLTAREN Stopped by: Fransisca Kaufmann Derisha Funderburke, MD   doxycycline 100 MG capsule Commonly known as: VIBRAMYCIN Stopped by: Fransisca Kaufmann Eliam Snapp, MD   prednisoLONE acetate 1 % ophthalmic suspension Commonly known as: PRED FORTE Stopped by: Fransisca Kaufmann Lakeyta Vandenheuvel, MD   predniSONE 10 MG tablet Commonly known as: DELTASONE Stopped by: Fransisca Kaufmann Marliyah Reid, MD   sulfamethoxazole-trimethoprim 800-160 MG tablet Commonly known as: BACTRIM DS Stopped by: Fransisca Kaufmann Jenifer Struve, MD       TAKE these medications    busPIRone 10 MG tablet Commonly known as: BUSPAR Take 1 tablet (10 mg total) by mouth 2 (two) times daily.   cetirizine 10 MG tablet Commonly known as: ZyrTEC Allergy Take 1 tablet (10 mg total) by mouth daily. Started by: Fransisca Kaufmann Tarrence Enck, MD   fluticasone 50 MCG/ACT nasal spray Commonly known as: FLONASE Place 1 spray into both nostrils 2 (two) times daily as needed for allergies or rhinitis. Started by: Worthy Rancher, MD   mirabegron ER 25 MG Tb24 tablet Commonly known as: MYRBETRIQ Take 1 tablet (25 mg total) by mouth daily.  tamsulosin 0.4 MG Caps capsule Commonly known as: FLOMAX Take 1 capsule (0.4 mg total) by mouth daily after supper.         Objective:   BP 123/74   Pulse (!) 54   Ht '5\' 9"'$  (1.753 m)   Wt 191 lb (86.6 kg)   SpO2 98%   BMI 28.21 kg/m   Wt Readings from Last 3 Encounters:  12/30/22 191 lb (86.6 kg)  11/16/22 186 lb (84.4 kg)  10/07/22 186 lb 9.6 oz (84.6 kg)    Physical Exam Vitals and nursing note reviewed.  Constitutional:      General: He is not in acute distress.    Appearance: He is well-developed. He is not diaphoretic.  HENT:     Mouth/Throat:     Mouth: Mucous membranes are moist.     Pharynx:  Oropharynx is clear. No oropharyngeal exudate or posterior oropharyngeal erythema.  Eyes:     General: No scleral icterus.    Conjunctiva/sclera: Conjunctivae normal.  Neck:     Thyroid: No thyromegaly.  Cardiovascular:     Rate and Rhythm: Normal rate and regular rhythm.     Heart sounds: Normal heart sounds. No murmur heard. Pulmonary:     Effort: Pulmonary effort is normal. No respiratory distress.     Breath sounds: Normal breath sounds. No wheezing, rhonchi or rales.  Musculoskeletal:        General: Normal range of motion.     Cervical back: Neck supple.  Lymphadenopathy:     Cervical: No cervical adenopathy.  Skin:    General: Skin is warm and dry.     Findings: No rash.  Neurological:     Mental Status: He is alert and oriented to person, place, and time.     Coordination: Coordination normal.  Psychiatric:        Behavior: Behavior normal.       Assessment & Plan:   Problem List Items Addressed This Visit   None Visit Diagnoses     Seasonal allergic rhinitis, unspecified trigger    -  Primary   Relevant Medications   cetirizine (ZYRTEC ALLERGY) 10 MG tablet   fluticasone (FLONASE) 50 MCG/ACT nasal spray       Sounds like seasonal allergies, will do Flonase and Zyrtec and the recommended take Benadryl at night.  If not improved from there over the next couple weeks and let us know   Follow up plan: Return if symptoms worsen or fail to improve.  Counseling provided for all of the vaccine components No orders of the defined types were placed in this encounter.   Caryl Pina, MD [facility] 12/30/2022, 9:26 AM

## 2023-02-02 ENCOUNTER — Ambulatory Visit: Payer: 59 | Admitting: Urology

## 2023-02-23 ENCOUNTER — Ambulatory Visit: Payer: 59 | Admitting: Urology

## 2023-02-23 ENCOUNTER — Encounter: Payer: Self-pay | Admitting: Urology

## 2023-02-23 VITALS — BP 138/77 | HR 75

## 2023-02-23 DIAGNOSIS — N411 Chronic prostatitis: Secondary | ICD-10-CM | POA: Diagnosis not present

## 2023-02-23 DIAGNOSIS — R350 Frequency of micturition: Secondary | ICD-10-CM

## 2023-02-23 LAB — URINALYSIS, ROUTINE W REFLEX MICROSCOPIC
Bilirubin, UA: NEGATIVE
Glucose, UA: NEGATIVE
Ketones, UA: NEGATIVE
Leukocytes,UA: NEGATIVE
Nitrite, UA: NEGATIVE
RBC, UA: NEGATIVE
Specific Gravity, UA: 1.025 (ref 1.005–1.030)
Urobilinogen, Ur: 0.2 mg/dL (ref 0.2–1.0)
pH, UA: 5.5 (ref 5.0–7.5)

## 2023-02-23 MED ORDER — CYCLOBENZAPRINE HCL 5 MG PO TABS: 5.0000 mg | ORAL_TABLET | Freq: Three times a day (TID) | ORAL | 3 refills | Status: DC | PRN

## 2023-02-23 MED ORDER — MELOXICAM 7.5 MG PO TABS: 7.5000 mg | ORAL_TABLET | Freq: Every day | ORAL | 3 refills | Status: DC

## 2023-02-23 NOTE — Progress Notes (Signed)
02/23/2023 9:44 AM   Howard Oliver [birth year] [id number]  Referring provider: Mechele Claude, MD [address] [place],  [place] [address]  Followup urinary frequency and prostatitis   HPI: Howard Oliver is a 47yo here for followup for prostatitis and urinary frequency. IPSS 19 QOL 5. He had a slight improvement in his urinary frequency on mirabegron. He notes his symptoms worsen with increased stress. He has intermittent dysuria which was improved with pyridium.    PMH: No past medical history on file.  Surgical History: Past Surgical History:  Procedure Laterality Date   CORNEAL TRANSPLANT Left     Home Medications:  Allergies as of 02/23/2023   No Known Allergies      Medication List        Accurate as of Feb 23, 2023  9:44 AM. If you have any questions, ask your nurse or doctor.          busPIRone 10 MG tablet Commonly known as: BUSPAR Take 1 tablet (10 mg total) by mouth 2 (two) times daily.   cetirizine 10 MG tablet Commonly known as: ZyrTEC Allergy Take 1 tablet (10 mg total) by mouth daily.   fluticasone 50 MCG/ACT nasal spray Commonly known as: FLONASE Place 1 spray into both nostrils 2 (two) times daily as needed for allergies or rhinitis.   mirabegron ER 25 MG Tb24 tablet Commonly known as: MYRBETRIQ Take 1 tablet (25 mg total) by mouth daily.   tamsulosin 0.4 MG Caps capsule Commonly known as: FLOMAX Take 1 capsule (0.4 mg total) by mouth daily after supper.        Allergies: No Known Allergies  Family History: Family History  Problem Relation Age of Onset   Cancer Mother    Cancer Father     Social History:  reports that he has been smoking e-cigarettes. He does not have any smokeless tobacco history on file. He reports current alcohol use of about 2.0 standard drinks of alcohol per week. He reports that he does not currently use drugs.  ROS: All other review of systems were reviewed and are negative except what is noted above in  HPI  Physical Exam: BP 138/77   Pulse 75   Constitutional:  Alert and oriented, No acute distress. HEENT: [place] AT, moist mucus membranes.  Trachea midline, no masses. Cardiovascular: No clubbing, cyanosis, or edema. Respiratory: Normal respiratory effort, no increased work of breathing. GI: Abdomen is soft, nontender, nondistended, no abdominal masses GU: No CVA tenderness.  Lymph: No cervical or inguinal lymphadenopathy. Skin: No rashes, bruises or suspicious lesions. Neurologic: Grossly intact, no focal deficits, moving all 4 extremities. Psychiatric: Normal mood and affect.  Laboratory Data: No results found for: "WBC", "HGB", "HCT", "MCV", "PLT"  No results found for: "CREATININE"  No results found for: "PSA"  No results found for: "TESTOSTERONE"  No results found for: "HGBA1C"  Urinalysis    Component Value Date/Time   APPEARANCEUR Clear 12/27/2022 1541   GLUCOSEU Negative 12/27/2022 1541   BILIRUBINUR Negative 12/27/2022 1541   PROTEINUR Negative 12/27/2022 1541   NITRITE Negative 12/27/2022 1541   LEUKOCYTESUR Negative 12/27/2022 1541    Lab Results  Component Value Date   LABMICR Comment 12/27/2022   WBCUA None seen 08/25/2022   LABEPIT 0-10 08/25/2022   BACTERIA Few (A) 08/25/2022    Pertinent Imaging:  No results found for this or any previous visit.  No results found for this or any previous visit.  No results found for this or any  previous visit.  No results found for this or any previous visit.  No results found for this or any previous visit.  No valid procedures specified. No results found for this or any previous visit.  No results found for this or any previous visit.   Assessment & Plan:    1. Chronic prostatitis without hematuria -meloxicam 7.5mg  daily and referral to Pelvic floor PT - Urinalysis, Routine w reflex microscopic  2. Urinary frequency -pelvic floor PT - Urinalysis, Routine w reflex microscopic   No follow-ups on  file.  Wilkie Aye, MD  [facility] [place]

## 2023-02-23 NOTE — Patient Instructions (Signed)
Prostatitis  Prostatitis is swelling of the prostate gland, also called the prostate. This gland is about 1.5 inches wide and 1 inch high, and it helps to make a fluid called semen. The prostate is below a man's bladder, in front of the butt (rectum). There are different types of prostatitis. What are the causes? One type of prostatitis is caused by an infection from germs (bacteria). Another type is not caused by germs. It may be caused by: Things having to do with the nervous system. This system includes thebrain, spinal cord, and nerves. An autoimmune response. This happens when the body's disease-fighting system attacks healthy tissue in the body by mistake. Psychological factors. These have to do with how the mind works. The causes of other types of prostatitis are normally not known. What are the signs or symptoms? Symptoms of this condition depend on the type of prostatitis you have. If your condition is caused by germs: You may feel pain or burning when you pee (urinate). You may pee often and all of a sudden. You may have problems starting to pee. You may have trouble emptying your bladder when you pee. You may have fever or chills. You may feel pain in your muscles, joints, low back, or lower belly. If you have other types of prostatitis: You may pee often or all of a sudden. You may have trouble starting to pee. You may have a weak flow when you pee. You may leak pee after using the bathroom. You may have other problems, such as: Abnormal fluid coming from the penis. Pain in the testicles or penis. Pain between the butt and the testicles. Pain when fluid comes out of the penis during sex. How is this treated? Treatment for this condition depends on the type of prostatitis. Treatment may include: Medicines. These may treat pain or swelling, or they may help relax muscles. Exercises to help you move better or get stronger (physical therapy). Heat therapy. Techniques to help  you control some of the ways that your body works. Exercises to help you relax. Antibiotic medicine, if your condition is caused by germs. Warm water baths (sitz baths) to relax muscles. Follow these instructions at home: Medicines Take over-the-counter and prescription medicines only as told by your doctor. If you were prescribed an antibiotic medicine, take it as told by your doctor. Do not stop using the antibiotic even if you start to feel better. Managing pain and swelling  Take sitz baths as told by your doctor. For a sitz bath, sit in warm water that is deep enough to cover your hips and butt. If told, put heat on the painful area. Do this as often as told by your doctor. Use the heat source that your doctor recommends, such as a moist heat pack or a heating pad. Place a towel between your skin and the heat source. Leave the heat on for 20-30 minutes. Take off the heat if your skin turns bright red. This is very important if you are unable to feel pain, heat, or cold. You may have a greater risk of getting burned. General instructions Do exercises as told by your doctor, if your doctor prescribed them. Keep all follow-up visits as told by your doctor. This is important. Where to find more information National Institute of Diabetes and Digestive and Kidney Diseases: https://www.niddk.nih.gov Contact a doctor if: Your symptoms get worse. You have a fever. Get help right away if: You have chills. You feel light-headed. You feel like you may   faint. You cannot pee. You have blood or clumps of blood (blood clots) in your pee. Summary Prostatitis is swelling of the prostate gland. There are different types of prostatitis. Treatment depends on the type that you have. Take over-the-counter and prescription medicines only as told by your doctor. Get help right away of you have chills, feel light-headed, or feel like you may faint. Also get help right away if you cannot pee or you have  blood or clumps of blood in your pee. This information is not intended to replace advice given to you by your health care provider. Make sure you discuss any questions you have with your health care provider. Document Revised: 08/19/2022 Document Reviewed: 08/19/2022 Elsevier Patient Education  2023 [employer]  

## 2023-02-26 ENCOUNTER — Other Ambulatory Visit: Payer: Self-pay | Admitting: Family Medicine

## 2023-02-26 DIAGNOSIS — J302 Other seasonal allergic rhinitis: Secondary | ICD-10-CM

## 2023-03-16 ENCOUNTER — Other Ambulatory Visit: Payer: Self-pay

## 2023-03-16 ENCOUNTER — Encounter: Payer: Self-pay | Admitting: Physical Therapy

## 2023-03-16 ENCOUNTER — Ambulatory Visit: Payer: 59 | Attending: Urology | Admitting: Physical Therapy

## 2023-03-16 DIAGNOSIS — N411 Chronic prostatitis: Secondary | ICD-10-CM | POA: Insufficient documentation

## 2023-03-16 DIAGNOSIS — M62838 Other muscle spasm: Secondary | ICD-10-CM | POA: Insufficient documentation

## 2023-03-16 DIAGNOSIS — M6281 Muscle weakness (generalized): Secondary | ICD-10-CM | POA: Insufficient documentation

## 2023-03-16 DIAGNOSIS — R279 Unspecified lack of coordination: Secondary | ICD-10-CM | POA: Insufficient documentation

## 2023-03-16 NOTE — Therapy (Signed)
OUTPATIENT PHYSICAL THERAPY  PELVIC EVALUATION   Patient Name: Howard Oliver MRN: [id number] DOB:10/02/1976, 47 y.o., male Today's Date: 03/18/2023  END OF SESSION:  PT End of Session - 03/18/23 1647     Visit Number 1    Date for PT Re-Evaluation 06/08/23    Authorization Type [employer]    PT Start Time 1535    PT Stop Time 1615    PT Time Calculation (min) 40 min    Activity Tolerance Patient tolerated treatment well    Behavior During Therapy [facility] for tasks assessed/performed             History reviewed. No pertinent past medical history. 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  Trying to walk more.  Now working third shift all the time so trying to do more exercises Fluid intake: 2-3 glasses of water, green tea, body armour electrolyte drink (50-60 oz)  PAIN:  Are you having pain? Yes NPRS scale: 6/10 Pain location:  abdomen lower and testicular  Pain type: pressure and also having numbness in back of legs, burning Pain description: intermittent   Aggravating factors: unsure - sometimes stress/anxiety Relieving factors: muscle relaxers  PRECAUTIONS: None  WEIGHT BEARING RESTRICTIONS: No  FALLS:  Has patient fallen in last 6  months? No  LIVING ENVIRONMENT: Lives with: lives with their spouse and 2 step kids, one fiance and one girlfriend Lives in: House/apartment   OCCUPATION: full time, Community education officer  PLOF: Independent  PATIENT GOALS: get rid of all pain and bladder symptoms  PERTINENT HISTORY:   Sexual abuse: No  BOWEL MOVEMENT: Pain with bowel movement: No Type of bowel movement:Strain No Fully empty rectum: Yes:     URINATION: Pain with urination: Yes sometimes Fully empty bladder: Yes: sometimes yes, sometimes no Stream: Weak sometimes Urgency: No Frequency: at worst  every 2 hours Leakage:  a small amount after  urinating Pads: No  INTERCOURSE: Pain with intercourse: During Climax  Ejaculation: Yes: sometimes   OBJECTIVE:   DIAGNOSTIC FINDINGS:    PATIENT SURVEYS:    PFIQ-7 = 43 (for bladder)  COGNITION: Overall cognitive status: Within functional limits for tasks assessed     SENSATION: Light touch: Proprioception:   MUSCLE LENGTH: Hamstrings: knees bent to touch toes Thomas test: 80%  LUMBAR SPECIAL TESTS:  ASLR - improve with compression  FUNCTIONAL TESTS:  Single leg - left leg external rotation  GAIT:  Comments: WFL   POSTURE: increased lumbar lordosis, decreased thoracic kyphosis, anterior pelvic tilt, and ribcage anterior  PELVIC ALIGNMENT: normal  LUMBARAROM/PROM:  A/PROM A/PROM  eval  Flexion 50%  Extension   Right lateral flexion   Left lateral flexion   Right rotation   Left rotation    (Blank rows = not tested)  LOWER EXTREMITY AROM/PROM:  A/PROM Right eval Left eval  Hip flexion    Hip extension    Hip abduction    Hip adduction    Hip internal rotation    Hip external rotation    Knee flexion    Knee extension    Ankle dorsiflexion    Ankle plantarflexion    Ankle inversion    Ankle eversion     (Blank rows = not tested)  LOWER EXTREMITY MMT:  MMT Right eval Left eval  Hip flexion 5 5  Hip extension 5 4  Hip  abduction 5 4  Hip adduction 5 5  Hip internal rotation 5 5  Hip external rotation 5 4  Knee flexion    Knee extension    Ankle dorsiflexion    Ankle plantarflexion    Ankle inversion    Ankle eversion     PALPATION: GENERAL adductors tight on right side              External Perineal Exam no tenderness posterior compartment is normal appearance, coccyx more flexed              Internal Pelvic Floor high tone and difficulty relaxing. Pt has tight puborectalis anteriorly on the left and referral of bladder discomfort Patient confirms identification and approves PT to assess internal pelvic floor and treatment Yes  PELVIC MMT:   MMT eval  Internal Anal Sphincter 4  External Anal Sphincter 4  Puborectalis 4  Diastasis Recti   (Blank rows = not tested)  TONE: high  TODAY'S TREATMENT:                                                                                                                              DATE: 03/16/23  EVAL and initial HEP breathing   PATIENT EDUCATION:  Education details: Access Code: [email or web address] Person educated: Patient Education method: Programmer, multimedia, Facilities manager, Verbal cues, and Handouts Education comprehension: verbalized understanding and returned demonstration  HOME EXERCISE PROGRAM: Access Code: [email or web address] URL: [email or web address] Date: 03/16/2023 Prepared by: Dwana Curd  Exercises - Diaphragmatic  Breathing at 90/90 Supported  - 1 x daily - 7 x weekly - 3 sets - 10 reps  ASSESSMENT:  CLINICAL IMPRESSION: Patient is a 47 y.o. male who was seen today for physical therapy evaluation and treatment for chronic prostatitis. Pt has high tone with more referred pain from pelvic floor on the left side.  Pt has core weakness and posture abnormalities as noted above.  Pt demonstrates core and hip weakness with ASLR and single leg stand.  Pt will benefit from skilled Pt to address all above mentioned impairments and muscle spasms  for pain management and improved function.  OBJECTIVE IMPAIRMENTS: decreased coordination, decreased endurance, decreased ROM, decreased strength, increased muscle spasms, impaired flexibility, impaired tone, postural dysfunction, and pain.   ACTIVITY LIMITATIONS: sitting, standing, and toileting  PARTICIPATION LIMITATIONS: community activity and occupation  PERSONAL FACTORS: Time since onset of injury/illness/exacerbation are also affecting patient's functional outcome.   REHAB POTENTIAL: Excellent  CLINICAL DECISION MAKING: Evolving/moderate complexity  EVALUATION COMPLEXITY: Moderate   GOALS: Goals reviewed with patient? Yes  SHORT TERM GOALS: Target date: 04/13/23  Ind with initial HEP Baseline: Goal status: INITIAL   LONG TERM GOALS: Target date: 06/08/23  Pt will be independent with advanced HEP to maintain improvements made throughout therapy  Baseline:  Goal status: INITIAL  2.  Pt will report 75% reduction of pain due to improvements in posture, strength, and muscle length  Baseline:  Goal status: INITIAL  3.  PFIQ reduced to 20 or less demonstrating improved Baseline:  Goal status: INITIAL  4.  Pt will not have leakage after urinating due to improved soft tissue length Baseline:  Goal status: INITIAL  5.  Pt will be able to reduce pain experienced in stressful situations or when beginning to get muscle spasms for improved pain management of no more than 1-2/10 Baseline:  Goal status: INITIAL     PLAN:  PT FREQUENCY: 1x/week  PT DURATION: 12 weeks  PLANNED INTERVENTIONS: Therapeutic exercises, Therapeutic activity, Neuromuscular re-education, Balance training, Gait training, Patient/Family education, Self Care, Joint mobilization, Dry Needling, Electrical stimulation, Cryotherapy, Moist heat, Taping, Traction, Biofeedback, Manual therapy, and Re-evaluation  PLAN FOR NEXT SESSION: breathing and bulging pelvic floor and work on lumbar, glute and h/s  stretches, possibly dry needling - give handout - wants to know about some exercises can do at [employer]   [employer], PT 03/18/2023, 4:47 PM

## 2023-03-24 ENCOUNTER — Other Ambulatory Visit: Payer: Self-pay | Admitting: Family Medicine

## 2023-03-28 ENCOUNTER — Telehealth: Payer: Self-pay

## 2023-03-28 NOTE — Transitions of Care (Post Inpatient/ED Visit) (Signed)
   03/28/2023  Name: Howard Oliver MRN: [id number] DOB: 23-Nov-1975  Today's TOC FU Call Status: Today's TOC FU Call Status:: Successful TOC FU Call Competed TOC FU Call Complete Date: 03/28/23  Transition Care Management Follow-up Telephone Call Date of Discharge: 03/27/23 [facility] Type of Discharge: Emergency Department Reason for ED Visit: Other: (laceration of right hand) How have you been since you were released from the hospital?: Same Any questions or concerns?: Yes Patient Questions/Concerns:: patient wants to know if he can get a work note he said that wrote him out from to night , however feels like he needs an extra day. Patient Questions/Concerns Addressed: Notified Provider of Patient Questions/Concerns  Items Reviewed: Did you receive and understand the discharge instructions provided?: Yes Medications obtained,verified, and reconciled?: Yes (Medications Reviewed) Any new allergies since your discharge?: No Dietary orders reviewed?: NA Do you have support at home?: Yes People in Home: spouse  Medications Reviewed Today: Medications Reviewed Today     Reviewed by Annabell Sabal, CMA (Certified Medical Assistant) on 03/28/23 at 1450  Med List Status: <None>   Medication Order Taking? Sig Documenting Provider Last Dose Status Informant  busPIRone (BUSPAR) 10 MG tablet [id number] Yes Take 1 tablet (10 mg total) by mouth 2 (two) times daily. Mechele Claude, MD Taking Active   cetirizine (ZYRTEC) 10 MG tablet [id number] Yes TAKE 1 TABLET BY MOUTH EVERY DAY Mechele Claude, MD Taking Active   cyclobenzaprine (FLEXERIL) 5 MG tablet [id number] Yes Take 1 tablet (5 mg total) by mouth 3 (three) times daily as needed for muscle spasms. McKenzie, Mardene Celeste, MD Taking Active   fluticasone [facility]) 50 MCG/ACT nasal spray [id number] Yes Place 1 spray into both nostrils 2 (two) times daily as needed for allergies or rhinitis. Dettinger, Elige Radon, MD Taking  Active   meloxicam [facility]) 7.5 MG tablet [id number] Yes Take 1 tablet (7.5 mg total) by mouth daily. McKenzie, Mardene Celeste, MD Taking Active   mirabegron ER [facility]) 25 MG TB24 tablet [id number] Yes Take 1 tablet (25 mg total) by mouth daily. McKenzie, Mardene Celeste, MD Taking Active   tamsulosin [facility]) 0.4 MG CAPS capsule [id number] Yes Take 1 capsule (0.4 mg total) by mouth daily after supper. McKenzie, Mardene Celeste, MD Taking Active             Home Care and Equipment/Supplies: Were Home Health Services Ordered?: No Any new equipment or medical supplies ordered?: No  Functional Questionnaire: Do you need assistance with bathing/showering or dressing?: No Do you need assistance with meal preparation?: No Do you need assistance with eating?: No Do you have difficulty maintaining continence: No Do you need assistance with getting out of bed/getting out of a chair/moving?: No Do you have difficulty managing or taking your medications?: No  Follow up appointments reviewed: PCP Follow-up appointment confirmed?: NA (patienr that his wife is a Engineer, civil (consulting) and she would remove sutuce , patient will call back to make an appointment if needed.) Specialist Hospital Follow-up appointment confirmed?: NA Do you need transportation to your follow-up appointment?: No Do you understand care options if your condition(s) worsen?: Yes-patient verbalized understanding    SIGNATURE Fredirick Maudlin

## 2023-04-04 ENCOUNTER — Ambulatory Visit: Payer: 59 | Attending: Urology | Admitting: Physical Therapy

## 2023-04-04 DIAGNOSIS — R279 Unspecified lack of coordination: Secondary | ICD-10-CM | POA: Diagnosis present

## 2023-04-04 DIAGNOSIS — M62838 Other muscle spasm: Secondary | ICD-10-CM | POA: Diagnosis present

## 2023-04-04 DIAGNOSIS — M6281 Muscle weakness (generalized): Secondary | ICD-10-CM | POA: Insufficient documentation

## 2023-04-04 NOTE — Therapy (Signed)
OUTPATIENT PHYSICAL THERAPY  PELVIC TREATMENT   Patient Name: Howard Oliver MRN: [id number] DOB:Jul 28, 1976, 47 y.o., male Today's Date: 04/04/2023  END OF SESSION:    No past medical history on file. 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  Rationale for Evaluation and Treatment: Rehabilitation  ONSET DATE: before last Christmas  SUBJECTIVE:                                                                                                                                                                                           SUBJECTIVE STATEMENT: Pt is having discomfort, urge, frequency.  Trying to walk more.  Now working third shift all the time so trying to do more exercises Fluid intake: 2-3 glasses of water, green tea, body armour electrolyte drink (50-60 oz)  PAIN:  Are you having pain? Yes NPRS scale: 6/10 Pain location:  abdomen lower and testicular  Pain type: pressure and also having numbness in back of legs, burning Pain description: intermittent   Aggravating factors: unsure - sometimes stress/anxiety Relieving factors: muscle relaxers  PRECAUTIONS: None  WEIGHT BEARING RESTRICTIONS: No  FALLS:  Has patient fallen in last 6 months? No  LIVING ENVIRONMENT: Lives with: lives with their spouse and 2 step kids, one fiance and one girlfriend Lives in: House/apartment   OCCUPATION: full time, Community education officer  PLOF: Independent  PATIENT GOALS: get rid of all pain and bladder symptoms  PERTINENT HISTORY:   Sexual abuse: No  BOWEL MOVEMENT: Pain with bowel movement: No Type of bowel movement:Strain No Fully empty rectum: Yes:      URINATION: Pain with urination: Yes sometimes Fully empty bladder: Yes: sometimes yes, sometimes no Stream: Weak sometimes Urgency: No Frequency: at worst  every 2 hours Leakage:  a small amount after urinating Pads: No  INTERCOURSE: Pain with intercourse: During Climax  Ejaculation: Yes: sometimes   OBJECTIVE:   DIAGNOSTIC FINDINGS:    PATIENT SURVEYS:    PFIQ-7 = 43 (for bladder)  COGNITION: Overall cognitive status: Within functional limits for tasks assessed     SENSATION: Light touch: Proprioception:   MUSCLE LENGTH: Hamstrings: knees bent to touch toes Thomas test: 80%  LUMBAR SPECIAL TESTS:  ASLR - improve with compression  FUNCTIONAL TESTS:  Single leg - left leg external rotation  GAIT:  Comments: WFL   POSTURE: increased lumbar lordosis, decreased thoracic kyphosis, anterior pelvic tilt, and ribcage anterior  PELVIC ALIGNMENT: normal  LUMBARAROM/PROM:  A/PROM A/PROM  eval  Flexion 50%  Extension   Right lateral flexion   Left lateral flexion   Right rotation   Left rotation    (Blank rows = not tested)  LOWER EXTREMITY AROM/PROM:  A/PROM Right eval Left eval  Hip flexion    Hip extension    Hip abduction    Hip adduction    Hip internal rotation    Hip external rotation    Knee flexion    Knee extension    Ankle dorsiflexion    Ankle plantarflexion    Ankle inversion    Ankle eversion     (Blank rows = not tested)  LOWER EXTREMITY MMT:  MMT Right eval Left eval  Hip flexion 5 5  Hip extension 5 4  Hip abduction 5 4  Hip adduction 5 5  Hip internal rotation 5 5  Hip external rotation 5 4  Knee flexion    Knee extension    Ankle dorsiflexion    Ankle plantarflexion    Ankle inversion    Ankle eversion     PALPATION: GENERAL adductors tight on right side              External Perineal Exam no tenderness posterior compartment is normal appearance, coccyx more flexed              Internal Pelvic Floor  high tone and difficulty relaxing. Pt has tight puborectalis anteriorly on the left and referral of bladder discomfort Patient confirms identification and approves PT to assess internal pelvic floor and treatment Yes  PELVIC MMT:   MMT eval  Internal Anal Sphincter 4  External Anal Sphincter 4  Puborectalis 4  Diastasis Recti   (Blank rows = not tested)  TONE: high  TODAY'S TREATMENT:                                                                                                                              DATE: 04/04/23   Manual: lumbar and hamstrings, adductors Skilled palpation and techniques with dry needling  Trigger Point Dry-Needling  Treatment instructions: Expect mild to moderate muscle soreness. S/S of pneumothorax if dry needled over a lung field, and to seek immediate medical attention should they occur. Patient verbalized understanding of these instructions and education.  Patient Consent Given: Yes Education handout provided: Yes Muscles treated: lumbar multifidi Electrical stimulation performed: No Parameters: N/A Treatment response/outcome: increased soft tissue length  NMRE Exercises: With breathing and bulging and activate transversus abdominus  Knee to chest with hips elevated Hamstring Adductors Child pose Side bend and rotation child pose Side lying rotation Hip rotation Hooklying transversus abdominus with UE overhead - then switch to press on thigh for better results     PATIENT EDUCATION:  Education details: Access Code: [email or web address] Person educated: Patient  Education method: Explanation, Demonstration, Verbal cues, and Handouts Education comprehension: verbalized understanding and returned demonstration  HOME EXERCISE PROGRAM: Access Code: [email or web address] URL: [email or web address] Date: 04/04/2023 Prepared by: Dwana Curd  Exercises - Child's Pose with Sidebending  - 1 x daily - 7 x weekly - 1 sets - 3 reps - 30 sec  hold - Cat to Child's Pose with Posterior Pelvic Tilt  - 1 x daily - 7 x weekly - 1 sets - 3 reps - 30-60 hold - Child's Pose with Thread the Needle  - 1 x daily - 7 x weekly - 1 sets - 3 reps - 30-60 hold - Supine Single Knee to Chest Stretch  - 1 x daily - 7 x weekly - 1 sets - 5 reps - 10 sec hold - Supine Transversus Abdominis Bracing - Hands on Thighs  - 1 x daily - 7 x weekly - 1 sets - 10 reps - 5 sec hold - Hip Adductors and Hamstring Stretch with Strap  - 1 x daily - 7 x weekly - 1 sets - 3 reps - 30 sec hold  ASSESSMENT:  CLINICAL IMPRESSION: Today's session focused on soft tissue release and stretches with breathing for initial HEP. Pt did well with transversus abdominus activation and improved thoracic lumbar mobility.  Very tight throughout and will benefit from PT to continue to address core stability and reduce overuse of back.  OBJECTIVE IMPAIRMENTS: decreased coordination, decreased endurance, decreased ROM, decreased strength, increased muscle spasms, impaired flexibility, impaired tone, postural dysfunction, and pain.   ACTIVITY LIMITATIONS: sitting, standing, and toileting  PARTICIPATION LIMITATIONS: community activity and occupation  PERSONAL FACTORS: Time since onset of injury/illness/exacerbation are also affecting patient's functional outcome.   REHAB POTENTIAL: Excellent  CLINICAL DECISION MAKING: Evolving/moderate complexity  EVALUATION COMPLEXITY: Moderate   GOALS: Goals reviewed with patient? Yes  SHORT TERM GOALS: Target date: 04/13/23  Ind with initial HEP Baseline: Goal status: IN PROGRESS   LONG TERM GOALS: Target date: 06/08/23  Pt will be independent with advanced HEP to maintain improvements made throughout therapy  Baseline:  Goal status: INITIAL  2.  Pt will report 75% reduction of pain due to improvements in posture, strength, and muscle length  Baseline:  Goal status: INITIAL  3.  PFIQ reduced to 20 or less demonstrating  improved Baseline:  Goal status: INITIAL  4.  Pt will not have leakage after urinating due to improved soft tissue length Baseline:  Goal status: INITIAL  5.  Pt will be able to reduce pain experienced in stressful situations or when beginning to get muscle spasms for improved pain management of no more than 1-2/10 Baseline:  Goal status: INITIAL     PLAN:  PT FREQUENCY: 1x/week  PT DURATION: 12 weeks  PLANNED INTERVENTIONS: Therapeutic exercises, Therapeutic activity, Neuromuscular re-education, Balance training, Gait training, Patient/Family education, Self Care, Joint mobilization, Dry Needling, Electrical stimulation, Cryotherapy, Moist heat, Taping, Traction, Biofeedback, Manual therapy, and Re-evaluation  PLAN FOR NEXT SESSION: breathing and bulging pelvic floor build core strength and when ready wants to know about some exercises can do at [employer], discussed doing internal STM as needed if pain and tension persist   [employer], PT 04/04/2023, 7:52 AM

## 2023-04-12 ENCOUNTER — Telehealth: Payer: Self-pay

## 2023-04-12 NOTE — Telephone Encounter (Signed)
Patient is moving appointment out until he can have more therapy.  He has only had one visit.

## 2023-04-12 NOTE — Telephone Encounter (Signed)
FYI

## 2023-04-12 NOTE — Therapy (Deleted)
OUTPATIENT PHYSICAL THERAPY  PELVIC TREATMENT   Patient Name: Howard Oliver MRN: [id number] DOB:25-Aug-1976, 47 y.o., male Today's Date: 04/12/2023  END OF SESSION:    No past medical history on file. 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  Rationale for Evaluation and Treatment: Rehabilitation  ONSET DATE: before last Christmas  SUBJECTIVE:                                                                                                                                                                                           SUBJECTIVE STATEMENT: Pt is having discomfort, urge, frequency.  Trying to walk more.  Now working third shift all the time so trying to do more exercises Fluid intake: 2-3 glasses of water, green tea, body armour electrolyte drink (50-60 oz)  PAIN:  Are you having pain? Yes NPRS scale: 6/10 Pain location:  abdomen lower and testicular  Pain type: pressure and also having numbness in back of legs, burning Pain description: intermittent   Aggravating factors: unsure - sometimes stress/anxiety Relieving factors: muscle relaxers  PRECAUTIONS: None  WEIGHT BEARING RESTRICTIONS: No  FALLS:  Has patient fallen in last 6 months? No  LIVING ENVIRONMENT: Lives with: lives with their spouse and 2 step kids, one fiance and one girlfriend Lives in: House/apartment   OCCUPATION: full time, Community education officer  PLOF: Independent  PATIENT GOALS: get rid of all pain and bladder symptoms  PERTINENT HISTORY:   Sexual abuse: No  BOWEL MOVEMENT: Pain with bowel movement: No Type of bowel movement:Strain No Fully empty rectum: Yes:      URINATION: Pain with urination: Yes sometimes Fully empty bladder: Yes: sometimes yes, sometimes no Stream: Weak sometimes Urgency: No Frequency: at worst  every 2 hours Leakage:  a small amount after urinating Pads: No  INTERCOURSE: Pain with intercourse: During Climax  Ejaculation: Yes: sometimes   OBJECTIVE:   DIAGNOSTIC FINDINGS:    PATIENT SURVEYS:    PFIQ-7 = 43 (for bladder)  COGNITION: Overall cognitive status: Within functional limits for tasks assessed     SENSATION: Light touch: Proprioception:   MUSCLE LENGTH: Hamstrings: knees bent to touch toes Thomas test: 80%  LUMBAR SPECIAL TESTS:  ASLR - improve with compression  FUNCTIONAL TESTS:  Single leg - left leg external rotation  GAIT:  Comments: WFL   POSTURE: increased lumbar lordosis, decreased thoracic kyphosis, anterior pelvic tilt, and ribcage anterior  PELVIC ALIGNMENT: normal  LUMBARAROM/PROM:  A/PROM A/PROM  eval  Flexion 50%  Extension   Right lateral flexion   Left lateral flexion   Right rotation   Left rotation    (Blank rows = not tested)  LOWER EXTREMITY AROM/PROM:  A/PROM Right eval Left eval  Hip flexion    Hip extension    Hip abduction    Hip adduction    Hip internal rotation    Hip external rotation    Knee flexion    Knee extension    Ankle dorsiflexion    Ankle plantarflexion    Ankle inversion    Ankle eversion     (Blank rows = not tested)  LOWER EXTREMITY MMT:  MMT Right eval Left eval  Hip flexion 5 5  Hip extension 5 4  Hip abduction 5 4  Hip adduction 5 5  Hip internal rotation 5 5  Hip external rotation 5 4  Knee flexion    Knee extension    Ankle dorsiflexion    Ankle plantarflexion    Ankle inversion    Ankle eversion     PALPATION: GENERAL adductors tight on right side              External Perineal Exam no tenderness posterior compartment is normal appearance, coccyx more flexed              Internal Pelvic Floor  high tone and difficulty relaxing. Pt has tight puborectalis anteriorly on the left and referral of bladder discomfort Patient confirms identification and approves PT to assess internal pelvic floor and treatment Yes  PELVIC MMT:   MMT eval  Internal Anal Sphincter 4  External Anal Sphincter 4  Puborectalis 4  Diastasis Recti   (Blank rows = not tested)  TONE: high  TODAY'S TREATMENT:                                                                                                                              DATE: 04/04/23   Manual: lumbar and hamstrings, adductors Skilled palpation and techniques with dry needling  Trigger Point Dry-Needling  Treatment instructions: Expect mild to moderate muscle soreness. S/S of pneumothorax if dry needled over a lung field, and to seek immediate medical attention should they occur. Patient verbalized understanding of these instructions and education.  Patient Consent Given: Yes Education handout provided: Yes Muscles treated: lumbar multifidi Electrical stimulation performed: No Parameters: N/A Treatment response/outcome: increased soft tissue length  NMRE Exercises: With breathing and bulging and activate transversus abdominus  Knee to chest with hips elevated Hamstring Adductors Child pose Side bend and rotation child pose Side lying rotation Hip rotation Hooklying transversus abdominus with UE overhead - then switch to press on thigh for better results     PATIENT EDUCATION:  Education details: Access Code: [email or web address] Person educated: Patient  Education method: Explanation, Demonstration, Verbal cues, and Handouts Education comprehension: verbalized understanding and returned demonstration  HOME EXERCISE PROGRAM: Access Code: [email or web address] URL: [email or web address] Date: 04/04/2023 Prepared by: Dwana Curd  Exercises - Child's Pose with Sidebending  - 1 x daily - 7 x weekly - 1 sets - 3 reps - 30 sec  hold - Cat to Child's Pose with Posterior Pelvic Tilt  - 1 x daily - 7 x weekly - 1 sets - 3 reps - 30-60 hold - Child's Pose with Thread the Needle  - 1 x daily - 7 x weekly - 1 sets - 3 reps - 30-60 hold - Supine Single Knee to Chest Stretch  - 1 x daily - 7 x weekly - 1 sets - 5 reps - 10 sec hold - Supine Transversus Abdominis Bracing - Hands on Thighs  - 1 x daily - 7 x weekly - 1 sets - 10 reps - 5 sec hold - Hip Adductors and Hamstring Stretch with Strap  - 1 x daily - 7 x weekly - 1 sets - 3 reps - 30 sec hold  ASSESSMENT:  CLINICAL IMPRESSION: Today's session focused on soft tissue release and stretches with breathing for initial HEP. Pt did well with transversus abdominus activation and improved thoracic lumbar mobility.  Very tight throughout and will benefit from PT to continue to address core stability and reduce overuse of back.  OBJECTIVE IMPAIRMENTS: decreased coordination, decreased endurance, decreased ROM, decreased strength, increased muscle spasms, impaired flexibility, impaired tone, postural dysfunction, and pain.   ACTIVITY LIMITATIONS: sitting, standing, and toileting  PARTICIPATION LIMITATIONS: community activity and occupation  PERSONAL FACTORS: Time since onset of injury/illness/exacerbation are also affecting patient's functional outcome.   REHAB POTENTIAL: Excellent  CLINICAL DECISION MAKING: Evolving/moderate complexity  EVALUATION COMPLEXITY: Moderate   GOALS: Goals reviewed with patient? Yes  SHORT TERM GOALS: Target date: 04/13/23  Ind with initial HEP Baseline: Goal status: IN PROGRESS   LONG TERM GOALS: Target date: 06/08/23  Pt will be independent with advanced HEP to maintain improvements made throughout therapy  Baseline:  Goal status: IN PROGRESS  2.  Pt will report 75% reduction of pain due to improvements in posture, strength, and muscle length  Baseline:  Goal status: IN PROGRESS  3.  PFIQ reduced to 20 or less demonstrating  improved Baseline:  Goal status: IN PROGRESS  4.  Pt will not have leakage after urinating due to improved soft tissue length Baseline:  Goal status: IN PROGRESS  5.  Pt will be able to reduce pain experienced in stressful situations or when beginning to get muscle spasms for improved pain management of no more than 1-2/10 Baseline:  Goal status: IN PROGRESS     PLAN:  PT FREQUENCY: 1x/week  PT DURATION: 12 weeks  PLANNED INTERVENTIONS: Therapeutic exercises, Therapeutic activity, Neuromuscular re-education, Balance training, Gait training, Patient/Family education, Self Care, Joint mobilization, Dry Needling, Electrical stimulation, Cryotherapy, Moist heat, Taping, Traction, Biofeedback, Manual therapy, and Re-evaluation  PLAN FOR NEXT SESSION: breathing and bulging pelvic floor build core strength and when ready wants to know about some exercises can do at [employer], discussed doing internal STM as needed if pain and tension persist   [employer], PT 04/12/2023, 5:40 PM

## 2023-04-13 ENCOUNTER — Ambulatory Visit: Payer: 59 | Admitting: Urology

## 2023-04-13 ENCOUNTER — Ambulatory Visit: Payer: 59 | Admitting: Physical Therapy

## 2023-04-13 ENCOUNTER — Telehealth: Payer: Self-pay | Admitting: Physical Therapy

## 2023-04-13 NOTE — Telephone Encounter (Signed)
Pt called due to no show of appointment.  Pt had just gotten off work and forgot, but rescheduled for tomorrow.  Russella Dar, PT, DPT 04/13/23 8:21 AM

## 2023-04-14 ENCOUNTER — Ambulatory Visit: Payer: 59 | Admitting: Physical Therapy

## 2023-04-14 DIAGNOSIS — M62838 Other muscle spasm: Secondary | ICD-10-CM | POA: Diagnosis not present

## 2023-04-14 DIAGNOSIS — M6281 Muscle weakness (generalized): Secondary | ICD-10-CM

## 2023-04-14 DIAGNOSIS — R279 Unspecified lack of coordination: Secondary | ICD-10-CM

## 2023-04-14 NOTE — Therapy (Signed)
OUTPATIENT PHYSICAL THERAPY  PELVIC TREATMENT   Patient Name: Howard Oliver MRN: [id number] DOB:Oct 10, 1976, 47 y.o., male Today's Date: 04/14/2023  END OF SESSION:  PT End of Session - 04/14/23 1300     Visit Number 3    Date for PT Re-Evaluation 06/08/23    Authorization Type [employer]    PT Start Time 1236    PT Stop Time 1314    PT Time Calculation (min) 38 min    Activity Tolerance Patient tolerated treatment well    Behavior During Therapy [facility] for tasks assessed/performed              No past medical history on file. 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 Fluid intake: 2-3 glasses of water, green tea, body armour electrolyte drink (50-60 oz)  PAIN:  Are you having pain? Yes NPRS scale: 2/10 Pain location:  abdomen lower and testicular  Pain type: pressure and also having numbness in back of legs, burning Pain description: intermittent   Aggravating factors: unsure - sometimes stress/anxiety Relieving factors: muscle relaxers  PRECAUTIONS: None  WEIGHT BEARING RESTRICTIONS: No  FALLS:  Has patient fallen in last 6 months? No  LIVING ENVIRONMENT: Lives with: lives with their spouse and 2 step kids, one fiance and one  girlfriend Lives in: House/apartment   OCCUPATION: full time, Community education officer  PLOF: Independent  PATIENT GOALS: get rid of all pain and bladder symptoms  PERTINENT HISTORY:   Sexual abuse: No  BOWEL MOVEMENT: Pain with bowel movement: No Type of bowel movement:Strain No Fully empty rectum: Yes:     URINATION: Pain with urination: Yes sometimes Fully empty bladder: Yes: sometimes yes, sometimes no Stream: Weak sometimes Urgency: No Frequency: at worst  every 2 hours Leakage:  a small amount after urinating Pads: No  INTERCOURSE: Pain with intercourse: During Climax  Ejaculation: Yes: sometimes   OBJECTIVE:  DIAGNOSTIC FINDINGS:    PATIENT SURVEYS:    PFIQ-7 = 43 (for bladder)  COGNITION: Overall cognitive status: Within functional limits for tasks assessed     SENSATION: Light touch: Proprioception:   MUSCLE LENGTH: Hamstrings: knees bent to touch toes Thomas test: 80%  LUMBAR SPECIAL TESTS:  ASLR - improve with compression  FUNCTIONAL TESTS:  Single leg - left leg external rotation  GAIT:  Comments: WFL   POSTURE: increased lumbar lordosis, decreased thoracic kyphosis, anterior pelvic tilt, and ribcage anterior  PELVIC ALIGNMENT: normal  LUMBARAROM/PROM:  A/PROM A/PROM  eval  Flexion 50%  Extension   Right lateral flexion   Left lateral flexion   Right rotation   Left rotation    (Blank rows = not tested)  LOWER EXTREMITY AROM/PROM:  A/PROM Right eval Left eval  Hip flexion    Hip extension    Hip abduction    Hip adduction    Hip internal rotation    Hip external rotation    Knee flexion    Knee extension    Ankle dorsiflexion    Ankle plantarflexion    Ankle inversion    Ankle eversion     (Blank rows = not tested)  LOWER EXTREMITY MMT:  MMT Right eval Left eval  Hip flexion 5 5  Hip extension 5 4  Hip abduction 5 4  Hip adduction 5 5  Hip internal rotation 5 5  Hip external rotation 5 4  Knee flexion     Knee extension    Ankle dorsiflexion    Ankle plantarflexion    Ankle inversion    Ankle eversion     PALPATION: GENERAL adductors tight on right side              External Perineal Exam no tenderness posterior compartment is normal appearance, coccyx more flexed              Internal Pelvic Floor high tone and difficulty relaxing. Pt has tight puborectalis anteriorly on the left and referral of bladder discomfort Patient confirms identification and approves PT to assess internal pelvic floor and treatment Yes  PELVIC MMT:   MMT eval  Internal Anal Sphincter 4  External Anal Sphincter 4  Puborectalis 4  Diastasis Recti   (Blank rows = not tested)  TONE: high  TODAY'S TREATMENT:                                                                                                                              DATE: 04/14/23   Manual: lumbar fascial release Patient confirms identification and approves physical therapist to perform internal soft tissue work  - did trigger point and stretching soft tissue internally to obturator (rt side more tight) - got increased soft tissue length bilaterally  NMRE: Quadruped upper extremity reaching cues to engage core correctly Breahting and bulging - breathe into tailbone cue   Not performed today: and hamstrings, adductors Skilled palpation and  techniques with dry needling  Trigger Point Dry-Needling  Treatment instructions: Expect mild to moderate muscle soreness. S/S of pneumothorax if dry needled over a lung field, and to seek immediate medical attention should they occur. Patient verbalized understanding of these instructions and education.  Patient Consent Given: Yes Education handout provided: Yes Muscles treated: lumbar multifidi Electrical stimulation performed: No Parameters: N/A Treatment response/outcome: increased soft tissue length  NMRE Exercises: With breathing and bulging and activate transversus abdominus  Knee to chest  with hips elevated Hamstring Adductors Child pose Side bend and rotation child pose Side lying rotation Hip rotation Hooklying transversus abdominus with UE overhead - then switch to press on thigh for better results     PATIENT EDUCATION:  Education details: Access Code: [email or web address] Person educated: Patient Education method: Programmer, multimedia, Demonstration, Verbal cues, and Handouts Education comprehension: verbalized understanding and returned demonstration  HOME EXERCISE PROGRAM: Access Code: [email or web address] URL: [email or web address] Date: 04/04/2023 Prepared by: Dwana Curd  Exercises - Child's Pose with Sidebending  - 1 x daily - 7 x weekly - 1 sets - 3 reps - 30 sec hold - Cat to Child's Pose with Posterior Pelvic Tilt  - 1 x daily - 7 x weekly - 1 sets - 3 reps - 30-60 hold - Child's Pose with Thread the Needle  - 1 x daily - 7 x weekly - 1 sets - 3 reps - 30-60 hold - Supine Single Knee to Chest Stretch  - 1 x daily - 7 x weekly - 1 sets - 5 reps - 10 sec hold - Supine Transversus Abdominis Bracing - Hands on Thighs  - 1 x daily - 7 x weekly - 1 sets - 10 reps - 5 sec hold - Hip Adductors and Hamstring Stretch with Strap  - 1 x daily - 7 x weekly - 1 sets - 3 reps - 30 sec hold  ASSESSMENT:  CLINICAL IMPRESSION: Pain has improved.  Today's session focused on soft tissue release of the pelvic floor internally - obturator and levators bil improved with manual.  Pt was cued to breathe into tailbone and able to bulge correctly.  Pt also given transversus abdominus activation in quadruped and exercise added to HEP to continue to work on strength without increased tension in lumbar.  Pt will benefit from PT to continue to address core stability and reduce overuse of back.  OBJECTIVE IMPAIRMENTS: decreased coordination, decreased endurance, decreased ROM, decreased strength, increased muscle spasms, impaired flexibility, impaired tone, postural dysfunction, and pain.    ACTIVITY LIMITATIONS: sitting, standing, and toileting  PARTICIPATION LIMITATIONS: community activity and occupation  PERSONAL FACTORS: Time since onset of injury/illness/exacerbation are also affecting patient's functional outcome.   REHAB POTENTIAL: Excellent  CLINICAL DECISION MAKING: Evolving/moderate complexity  EVALUATION COMPLEXITY: Moderate   GOALS: Goals reviewed with patient? Yes  SHORT TERM GOALS: Target date: 04/13/23 Updated 04/14/23  Ind with initial HEP Baseline: Goal status: MET   LONG TERM GOALS: Target date: 06/08/23  Pt will be independent with advanced HEP to maintain improvements made throughout therapy  Baseline:  Goal status: IN PROGRESS  2.  Pt will report 75% reduction of pain due to improvements in posture, strength, and muscle length  Baseline:  Goal status: IN PROGRESS  3.  PFIQ reduced to 20 or less demonstrating improved Baseline:  Goal status: IN PROGRESS  4.  Pt will not have leakage after urinating due to improved soft tissue length Baseline:  Goal status: IN PROGRESS  5.  Pt will be  able to reduce pain experienced in stressful situations or when beginning to get muscle spasms for improved pain management of no more than 1-2/10 Baseline:  Goal status: IN PROGRESS     PLAN:  PT FREQUENCY: 1x/week  PT DURATION: 12 weeks  PLANNED INTERVENTIONS: Therapeutic exercises, Therapeutic activity, Neuromuscular re-education, Balance training, Gait training, Patient/Family education, Self Care, Joint mobilization, Dry Needling, Electrical stimulation, Cryotherapy, Moist heat, Taping, Traction, Biofeedback, Manual therapy, and Re-evaluation  PLAN FOR NEXT SESSION: breathing and bulging pelvic floor build core strength and when ready wants to know about some exercises can do at [employer], discussed doing internal STM as needed if pain and tension persist   [employer], PT 04/14/2023, 1:23 PM

## 2023-04-18 ENCOUNTER — Telehealth: Payer: Self-pay | Admitting: Physical Therapy

## 2023-04-18 ENCOUNTER — Ambulatory Visit: Payer: 59 | Attending: Urology | Admitting: Physical Therapy

## 2023-04-18 DIAGNOSIS — R279 Unspecified lack of coordination: Secondary | ICD-10-CM | POA: Insufficient documentation

## 2023-04-18 DIAGNOSIS — M6281 Muscle weakness (generalized): Secondary | ICD-10-CM | POA: Insufficient documentation

## 2023-04-18 DIAGNOSIS — M62838 Other muscle spasm: Secondary | ICD-10-CM | POA: Insufficient documentation

## 2023-04-18 NOTE — Telephone Encounter (Signed)
Pt was called due to no show.  States he got a reminder for next week but not today and did not realize he had one today.  Russella Dar, PT, DPT 04/18/23 2:32 PM

## 2023-04-18 NOTE — Therapy (Deleted)
OUTPATIENT PHYSICAL THERAPY  PELVIC TREATMENT   Patient Name: Howard Oliver MRN: [id number] DOB:08/09/1976, 47 y.o., male Today's Date: 04/18/2023  END OF SESSION:     No past medical history on file. 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  Rationale for Evaluation and Treatment: Rehabilitation  ONSET DATE: before last Christmas  SUBJECTIVE:                                                                                                                                                                                           SUBJECTIVE STATEMENT: Pt reports pain is better since last time. Fluid intake: 2-3 glasses of water, green tea, body armour electrolyte drink (50-60 oz)  PAIN:  Are you having pain? Yes NPRS scale: 2/10 Pain location:  abdomen lower and testicular  Pain type: pressure and also having numbness in back of legs, burning Pain description: intermittent   Aggravating factors: unsure - sometimes stress/anxiety Relieving factors: muscle relaxers  PRECAUTIONS: None  WEIGHT BEARING RESTRICTIONS: No  FALLS:  Has patient fallen in last 6 months? No  LIVING ENVIRONMENT: Lives with: lives with their spouse and 2 step kids, one fiance and one girlfriend Lives in: House/apartment   OCCUPATION: full time, Community education officer  PLOF: Independent  PATIENT GOALS: get rid of all pain and bladder symptoms  PERTINENT HISTORY:   Sexual abuse: No  BOWEL MOVEMENT: Pain with bowel movement: No Type of bowel movement:Strain No Fully empty rectum: Yes:     URINATION: Pain with urination: Yes sometimes Fully empty bladder: Yes: sometimes yes,  sometimes no Stream: Weak sometimes Urgency: No Frequency: at worst  every 2 hours Leakage:  a small amount after urinating Pads: No  INTERCOURSE: Pain with intercourse: During Climax  Ejaculation: Yes: sometimes   OBJECTIVE:   DIAGNOSTIC FINDINGS:    PATIENT SURVEYS:    PFIQ-7 = 43 (for bladder)  COGNITION: Overall cognitive status: Within functional limits for tasks assessed     SENSATION: Light touch: Proprioception:   MUSCLE LENGTH: Hamstrings: knees bent to touch toes Thomas test: 80%  LUMBAR SPECIAL TESTS:  ASLR - improve with compression  FUNCTIONAL TESTS:  Single leg - left leg external rotation  GAIT:  Comments: WFL   POSTURE: increased lumbar lordosis, decreased thoracic kyphosis, anterior pelvic tilt, and ribcage  anterior  PELVIC ALIGNMENT: normal  LUMBARAROM/PROM:  A/PROM A/PROM  eval  Flexion 50%  Extension   Right lateral flexion   Left lateral flexion   Right rotation   Left rotation    (Blank rows = not tested)  LOWER EXTREMITY AROM/PROM:  A/PROM Right eval Left eval  Hip flexion    Hip extension    Hip abduction    Hip adduction    Hip internal rotation    Hip external rotation    Knee flexion    Knee extension    Ankle dorsiflexion    Ankle plantarflexion    Ankle inversion    Ankle eversion     (Blank rows = not tested)  LOWER EXTREMITY MMT:  MMT Right eval Left eval  Hip flexion 5 5  Hip extension 5 4  Hip abduction 5 4  Hip adduction 5 5  Hip internal rotation 5 5  Hip external rotation 5 4  Knee flexion    Knee extension    Ankle dorsiflexion    Ankle plantarflexion    Ankle inversion    Ankle eversion     PALPATION: GENERAL adductors tight on right side              External Perineal Exam no tenderness posterior compartment is normal appearance, coccyx more flexed              Internal Pelvic Floor high tone and difficulty relaxing. Pt has tight puborectalis anteriorly on the left and referral  of bladder discomfort Patient confirms identification and approves PT to assess internal pelvic floor and treatment Yes  PELVIC MMT:   MMT eval  Internal Anal Sphincter 4  External Anal Sphincter 4  Puborectalis 4  Diastasis Recti   (Blank rows = not tested)  TONE: high  TODAY'S TREATMENT:                                                                                                                              DATE: 04/14/23   Manual: lumbar fascial release Patient confirms identification and approves physical therapist to perform internal soft tissue work  - did trigger point and stretching soft tissue internally to obturator (rt side more tight) - got increased soft tissue length bilaterally  NMRE: Quadruped upper extremity reaching cues to engage core correctly Breahting and bulging - breathe into tailbone cue   Not performed today: and hamstrings, adductors Skilled palpation and techniques with dry needling  Trigger Point Dry-Needling  Treatment instructions: Expect mild to moderate muscle soreness. S/S of pneumothorax if dry needled over a lung field, and to seek immediate medical attention should they occur. Patient verbalized understanding of these instructions and education.  Patient Consent Given: Yes Education handout provided: Yes Muscles treated: lumbar multifidi Electrical stimulation performed: No Parameters: N/A Treatment response/outcome: increased soft tissue length  NMRE Exercises: With breathing and bulging and activate transversus abdominus  Knee to chest with hips elevated  Hamstring Adductors Child pose Side bend and rotation child pose Side lying rotation Hip rotation Hooklying transversus abdominus with UE overhead - then switch to press on thigh for better results     PATIENT EDUCATION:  Education details: Access Code: [email or web address] Person educated: Patient Education method: Programmer, multimedia, Demonstration, Verbal cues, and Handouts Education  comprehension: verbalized understanding and returned demonstration  HOME EXERCISE PROGRAM: Access Code: [email or web address] URL: [email or web address] Date: 04/04/2023 Prepared by: Dwana Curd  Exercises - Child's Pose with Sidebending  - 1 x daily - 7 x weekly - 1 sets - 3 reps - 30 sec hold - Cat to Child's Pose with Posterior Pelvic Tilt  - 1 x daily - 7 x weekly - 1 sets - 3 reps - 30-60 hold - Child's Pose with Thread the Needle  - 1 x daily - 7 x weekly - 1 sets - 3 reps - 30-60 hold - Supine Single Knee to Chest Stretch  - 1 x daily - 7 x weekly - 1 sets - 5 reps - 10 sec hold - Supine Transversus Abdominis Bracing - Hands on Thighs  - 1 x daily - 7 x weekly - 1 sets - 10 reps - 5 sec hold - Hip Adductors and Hamstring Stretch with Strap  - 1 x daily - 7 x weekly - 1 sets - 3 reps - 30 sec hold  ASSESSMENT:  CLINICAL IMPRESSION: Pain has improved.  Today's session focused on soft tissue release of the pelvic floor internally - obturator and levators bil improved with manual.  Pt was cued to breathe into tailbone and able to bulge correctly.  Pt also given transversus abdominus activation in quadruped and exercise added to HEP to continue to work on strength without increased tension in lumbar.  Pt will benefit from PT to continue to address core stability and reduce overuse of back.  OBJECTIVE IMPAIRMENTS: decreased coordination, decreased endurance, decreased ROM, decreased strength, increased muscle spasms, impaired flexibility, impaired tone, postural dysfunction, and pain.   ACTIVITY LIMITATIONS: sitting, standing, and toileting  PARTICIPATION LIMITATIONS: community activity and occupation  PERSONAL FACTORS: Time since onset of injury/illness/exacerbation are also affecting patient's functional outcome.   REHAB POTENTIAL: Excellent  CLINICAL DECISION MAKING: Evolving/moderate complexity  EVALUATION COMPLEXITY: Moderate   GOALS: Goals reviewed with patient?  Yes  SHORT TERM GOALS: Target date: 04/13/23 Updated 04/14/23  Ind with initial HEP Baseline: Goal status: MET   LONG TERM GOALS: Target date: 06/08/23  Pt will be independent with advanced HEP to maintain improvements made throughout therapy  Baseline:  Goal status: IN PROGRESS  2.  Pt will report 75% reduction of pain due to improvements in posture, strength, and muscle length  Baseline:  Goal status: IN PROGRESS  3.  PFIQ reduced to 20 or less demonstrating improved Baseline:  Goal status: IN PROGRESS  4.  Pt will not have leakage after urinating due to improved soft tissue length Baseline:  Goal status: IN PROGRESS  5.  Pt will be able to reduce pain experienced in stressful situations or when beginning to get muscle spasms for improved pain management of no more than 1-2/10 Baseline:  Goal status: IN PROGRESS     PLAN:  PT FREQUENCY: 1x/week  PT DURATION: 12 weeks  PLANNED INTERVENTIONS: Therapeutic exercises, Therapeutic activity, Neuromuscular re-education, Balance training, Gait training, Patient/Family education, Self Care, Joint mobilization, Dry Needling, Electrical stimulation, Cryotherapy, Moist heat, Taping, Traction, Biofeedback, Manual therapy, and Re-evaluation  PLAN FOR NEXT SESSION: breathing and bulging pelvic floor  build core strength and when ready wants to know about some exercises can do at [employer], discussed doing internal STM as needed if pain and tension persist   [employer], PT 04/18/2023, 9:27 AM

## 2023-04-25 ENCOUNTER — Ambulatory Visit: Payer: 59 | Admitting: Physical Therapy

## 2023-05-02 ENCOUNTER — Ambulatory Visit: Payer: 59 | Admitting: Physical Therapy

## 2023-05-02 ENCOUNTER — Encounter: Payer: Self-pay | Admitting: Physical Therapy

## 2023-05-02 DIAGNOSIS — R279 Unspecified lack of coordination: Secondary | ICD-10-CM | POA: Diagnosis present

## 2023-05-02 DIAGNOSIS — M62838 Other muscle spasm: Secondary | ICD-10-CM | POA: Diagnosis present

## 2023-05-02 DIAGNOSIS — M6281 Muscle weakness (generalized): Secondary | ICD-10-CM | POA: Diagnosis present

## 2023-05-02 NOTE — Therapy (Addendum)
OUTPATIENT PHYSICAL THERAPY  PELVIC TREATMENT   Patient Name: Howard Oliver MRN: [id number] DOB:03-02-1976, 47 y.o., male Today's Date: 05/02/2023  END OF SESSION:  PT End of Session - 05/02/23 1435     Visit Number 4    Date for PT Re-Evaluation 06/08/23    Authorization Type [employer]    PT Start Time 1435    PT Stop Time 1520    PT Time Calculation (min) 45 min    Activity Tolerance Patient tolerated treatment well    Behavior During Therapy [facility] for tasks assessed/performed               History reviewed. No pertinent past medical history. 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   THERAPY DIAG:  Other muscle spasm  Muscle weakness (generalized)  Unspecified lack of coordination  Rationale for Evaluation and Treatment: Rehabilitation  ONSET DATE: before last Christmas  SUBJECTIVE:                                                                                                                                                                                           SUBJECTIVE STATEMENT: I felt about the same since last time, but more abdominal pain since being busy Fluid intake: 2-3 glasses of water, green tea, body armour electrolyte drink (50-60 oz)  PAIN:  Are you having pain? Yes NPRS scale: 4/10 Pain location:  abdomen lower   Pain type: pressure and tightness (not pain) Pain description: intermittent   Aggravating factors: unsure - maybe worse since not doing stretches Relieving factors: muscle relaxers  PRECAUTIONS: None  WEIGHT BEARING RESTRICTIONS: No  FALLS:  Has patient fallen in last 6 months? No  LIVING ENVIRONMENT: Lives with: lives with their spouse and 2  step kids, one fiance and one girlfriend Lives in: House/apartment   OCCUPATION: full time, Community education officer  PLOF: Independent  PATIENT GOALS: get rid of all pain and bladder symptoms  PERTINENT HISTORY:   Sexual abuse: No  BOWEL MOVEMENT: Pain with bowel movement: No Type of bowel movement:Strain No Fully empty rectum: Yes:     URINATION: Pain with urination: Yes sometimes Fully empty bladder: Yes: sometimes yes, sometimes no Stream: Weak sometimes Urgency: No Frequency: at worst  every 2 hours Leakage:  a small amount after urinating Pads: No  INTERCOURSE: Pain with intercourse: During Climax  Ejaculation: Yes: sometimes   OBJECTIVE:   DIAGNOSTIC FINDINGS:    PATIENT SURVEYS:    PFIQ-7 = 43 (for bladder)  COGNITION: Overall cognitive status: Within functional limits for tasks assessed     SENSATION: Light touch: Proprioception:   MUSCLE LENGTH: Hamstrings: knees bent to touch toes Thomas test: 80%  LUMBAR SPECIAL TESTS:  ASLR - improve with compression  FUNCTIONAL TESTS:  Single leg - left leg external rotation  GAIT:  Comments: WFL   POSTURE: increased lumbar lordosis, decreased thoracic kyphosis, anterior pelvic tilt, and ribcage anterior  PELVIC ALIGNMENT: normal  LUMBARAROM/PROM:  A/PROM A/PROM  eval  Flexion 50%  Extension   Right lateral flexion   Left lateral flexion   Right rotation   Left rotation    (Blank rows = not tested)  LOWER EXTREMITY AROM/PROM:  A/PROM Right eval Left eval  Hip flexion    Hip extension    Hip abduction    Hip adduction    Hip internal rotation    Hip external rotation    Knee flexion    Knee extension    Ankle dorsiflexion    Ankle plantarflexion    Ankle inversion    Ankle eversion     (Blank rows = not tested)  LOWER EXTREMITY MMT:  MMT Right eval Left eval  Hip flexion 5 5  Hip extension 5 4  Hip abduction 5 4  Hip adduction 5 5  Hip internal rotation 5 5  Hip external  rotation 5 4  Knee flexion    Knee extension    Ankle dorsiflexion    Ankle plantarflexion    Ankle inversion    Ankle eversion     PALPATION: GENERAL adductors tight on right side              External Perineal Exam no tenderness posterior compartment is normal appearance, coccyx more flexed              Internal Pelvic Floor high tone and difficulty relaxing. Pt has tight puborectalis anteriorly on the left and referral of bladder discomfort Patient confirms identification and approves PT to assess internal pelvic floor and treatment Yes  PELVIC MMT:   MMT eval  Internal Anal Sphincter 4  External Anal Sphincter 4  Puborectalis 4  Diastasis Recti   (Blank rows = not tested)  TONE: high  TODAY'S TREATMENT:                                                                                                                              DATE: 05/02/23  Quadruped on pball - transversus abdominus activation Sitting on the pball breathing, circles Sitting shoulder press - 20x Seated bicep curl on pball 15x Shoulder ext in small squat - 7lb cable machine - 20x Pully - shoulder ext with march Pulley pallof press half kneel Backward lunge sliders - 10x each   04/14/23   Manual: lumbar fascial release Patient confirms  identification and approves physical therapist to perform internal soft tissue work  - did trigger point and stretching soft tissue internally to obturator (rt side more tight) - got increased soft tissue length bilaterally  NMRE: Quadruped upper extremity reaching cues to engage core correctly Breahting and bulging - breathe into tailbone cue   Not performed today: and hamstrings, adductors Skilled palpation and techniques with dry needling  Trigger Point Dry-Needling  Treatment instructions: Expect mild to moderate muscle soreness. S/S of pneumothorax if dry needled over a lung field, and to seek immediate medical attention should they occur. Patient verbalized  understanding of these instructions and education.  Patient Consent Given: Yes Education handout provided: Yes Muscles treated: lumbar multifidi Electrical stimulation performed: No Parameters: N/A Treatment response/outcome: increased soft tissue length  NMRE Exercises: With breathing and bulging and activate transversus abdominus  Knee to chest with hips elevated Hamstring Adductors Child pose Side bend and rotation child pose Side lying rotation Hip rotation Hooklying transversus abdominus with UE overhead - then switch to press on thigh for better results     PATIENT EDUCATION:  Education details: Access Code: [email or web address] Person educated: Patient Education method: Programmer, multimedia, Demonstration, Verbal cues, and Handouts Education comprehension: verbalized understanding and returned demonstration  HOME EXERCISE PROGRAM: Access Code: [email or web address] URL: [email or web address] Date: 05/02/2023 Prepared by: Dwana Curd  Exercises - Child's Pose with Sidebending  - 1 x daily - 7 x weekly - 1 sets - 3 reps - 30 sec hold - Cat to Child's Pose with Posterior Pelvic Tilt  - 1 x daily - 7 x weekly - 1 sets - 3 reps - 30-60 hold - Child's Pose with Thread the Needle  - 1 x daily - 7 x weekly - 1 sets - 3 reps - 30-60 hold - Supine Single Knee to Chest Stretch  - 1 x daily - 7 x weekly - 1 sets - 5 reps - 10 sec hold - Supine Transversus Abdominis Bracing - Hands on Thighs  - 1 x daily - 7 x weekly - 1 sets - 10 reps - 5 sec hold - Hip Adductors and Hamstring Stretch with Strap  - 1 x daily - 7 x weekly - 1 sets - 3 reps - 30 sec hold - Quadruped Alternating Arm Lift  - 1 x daily - 7 x weekly - 2 sets - 10 reps - Resistance Pulldown with March  - 1 x daily - 7 x weekly - 3 sets - 10 reps - Half Kneeling Anti-Rotation Press - Forward Leg Opposite Anchor Side  - 1 x daily - 7 x weekly - 3 sets - 10 reps - Reverse Lunge on Slider  - 1 x daily - 7 x weekly - 3 sets - 10  reps - Correct Seated Posture  - 1 x daily - 7 x weekly - 3 sets - 10 reps - Standing Single Arm Bicep Curls Supinated with Dumbbell  - 1 x daily - 7 x weekly - 3 sets - 10 reps  Patient Education - Trigger Point Dry Needling ASSESSMENT:  CLINICAL IMPRESSION: Pt did not do as much of exercises and pain feels worse.  Pt feels like exercises helped more than soft tissue work.  Focused on core strength and improved functional movements with breathing.  Pt needs cues for exhale on exertion and not using midback when doing exercises.  Pt will benefit from skilled PT to continue to progress strength and reach all functional goals for pain management.  OBJECTIVE  IMPAIRMENTS: decreased coordination, decreased endurance, decreased ROM, decreased strength, increased muscle spasms, impaired flexibility, impaired tone, postural dysfunction, and pain.   ACTIVITY LIMITATIONS: sitting, standing, and toileting  PARTICIPATION LIMITATIONS: community activity and occupation  PERSONAL FACTORS: Time since onset of injury/illness/exacerbation are also affecting patient's functional outcome.   REHAB POTENTIAL: Excellent  CLINICAL DECISION MAKING: Evolving/moderate complexity  EVALUATION COMPLEXITY: Moderate   GOALS: Goals reviewed with patient? Yes  SHORT TERM GOALS: Target date: 04/13/23 Updated 04/14/23  Ind with initial HEP Baseline: Goal status: MET   LONG TERM GOALS: Target date: 06/08/23  Pt will be independent with advanced HEP to maintain improvements made throughout therapy  Baseline:  Goal status: IN PROGRESS  2.  Pt will report 75% reduction of pain due to improvements in posture, strength, and muscle length  Baseline:  Goal status: IN PROGRESS  3.  PFIQ reduced to 20 or less demonstrating improved Baseline:  Goal status: IN PROGRESS  4.  Pt will not have leakage after urinating due to improved soft tissue length Baseline:  Goal status: IN PROGRESS  5.  Pt will be able to  reduce pain experienced in stressful situations or when beginning to get muscle spasms for improved pain management of no more than 1-2/10 Baseline:  Goal status: IN PROGRESS     PLAN:  PT FREQUENCY: 1x/week  PT DURATION: 12 weeks  PLANNED INTERVENTIONS: Therapeutic exercises, Therapeutic activity, Neuromuscular re-education, Balance training, Gait training, Patient/Family education, Self Care, Joint mobilization, Dry Needling, Electrical stimulation, Cryotherapy, Moist heat, Taping, Traction, Biofeedback, Manual therapy, and Re-evaluation  PLAN FOR NEXT SESSION: breathing and keeping core engaged without ribcage protruding anteriorly; add squats and dead lift next   [employer], PT 05/02/2023, 2:36 PM   PHYSICAL THERAPY DISCHARGE SUMMARY  Visits from Start of Care: 4  Current functional level related to goals / functional outcomes: See above for most current PT status.  Pt didn't return to PT.     Remaining deficits: See above for current status.    Education / Equipment: HEP   Patient agrees to discharge. Patient goals were partially met. Patient is being discharged due to not returning since the last visit.  Lorrene Reid, PT 11/21/23 10:06 AM   [facility] [address] [place], [place] [address] Phone # [phone] Fax [phone]

## 2023-05-12 ENCOUNTER — Ambulatory Visit: Payer: 59 | Admitting: Physical Therapy

## 2023-05-16 NOTE — Therapy (Deleted)
OUTPATIENT PHYSICAL THERAPY  PELVIC TREATMENT   Patient Name: Howard Oliver MRN: [id number] DOB:17-Nov-1975, 47 y.o., male Today's Date: 05/16/2023  END OF SESSION:      No past medical history on file. 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  Rationale for Evaluation and Treatment: Rehabilitation  ONSET DATE: before last Christmas  SUBJECTIVE:                                                                                                                                                                                           SUBJECTIVE STATEMENT: I felt about the same since last time, but more abdominal pain since being busy Fluid intake: 2-3 glasses of water, green tea, body armour electrolyte drink (50-60 oz)  PAIN:  Are you having pain? Yes NPRS scale: 4/10 Pain location:  abdomen lower   Pain type: pressure and tightness (not pain) Pain description: intermittent   Aggravating factors: unsure - maybe worse since not doing stretches Relieving factors: muscle relaxers  PRECAUTIONS: None  WEIGHT BEARING RESTRICTIONS: No  FALLS:  Has patient fallen in last 6 months? No  LIVING ENVIRONMENT: Lives with: lives with their spouse and 2 step kids, one fiance and one girlfriend Lives in: House/apartment   OCCUPATION: full time, Community education officer  PLOF: Independent  PATIENT GOALS: get rid of all pain and bladder symptoms  PERTINENT HISTORY:   Sexual abuse: No  BOWEL MOVEMENT: Pain with bowel movement: No Type of bowel movement:Strain No Fully empty rectum: Yes:     URINATION: Pain with urination: Yes sometimes Fully empty bladder: Yes:  sometimes yes, sometimes no Stream: Weak sometimes Urgency: No Frequency: at worst  every 2 hours Leakage:  a small amount after urinating Pads: No  INTERCOURSE: Pain with intercourse: During Climax  Ejaculation: Yes: sometimes   OBJECTIVE:   DIAGNOSTIC FINDINGS:    PATIENT SURVEYS:    PFIQ-7 = 43 (for bladder)  COGNITION: Overall cognitive status: Within functional limits for tasks assessed     SENSATION: Light touch: Proprioception:   MUSCLE LENGTH: Hamstrings: knees bent to touch toes Thomas test: 80%  LUMBAR SPECIAL TESTS:  ASLR - improve with compression  FUNCTIONAL TESTS:  Single leg - left leg external rotation  GAIT:  Comments: WFL   POSTURE: increased lumbar lordosis, decreased thoracic  kyphosis, anterior pelvic tilt, and ribcage anterior  PELVIC ALIGNMENT: normal  LUMBARAROM/PROM:  A/PROM A/PROM  eval  Flexion 50%  Extension   Right lateral flexion   Left lateral flexion   Right rotation   Left rotation    (Blank rows = not tested)  LOWER EXTREMITY AROM/PROM:  A/PROM Right eval Left eval  Hip flexion    Hip extension    Hip abduction    Hip adduction    Hip internal rotation    Hip external rotation    Knee flexion    Knee extension    Ankle dorsiflexion    Ankle plantarflexion    Ankle inversion    Ankle eversion     (Blank rows = not tested)  LOWER EXTREMITY MMT:  MMT Right eval Left eval  Hip flexion 5 5  Hip extension 5 4  Hip abduction 5 4  Hip adduction 5 5  Hip internal rotation 5 5  Hip external rotation 5 4  Knee flexion    Knee extension    Ankle dorsiflexion    Ankle plantarflexion    Ankle inversion    Ankle eversion     PALPATION: GENERAL adductors tight on right side              External Perineal Exam no tenderness posterior compartment is normal appearance, coccyx more flexed              Internal Pelvic Floor high tone and difficulty relaxing. Pt has tight puborectalis anteriorly on the  left and referral of bladder discomfort Patient confirms identification and approves PT to assess internal pelvic floor and treatment Yes  PELVIC MMT:   MMT eval  Internal Anal Sphincter 4  External Anal Sphincter 4  Puborectalis 4  Diastasis Recti   (Blank rows = not tested)  TONE: high  TODAY'S TREATMENT:                                                                                                                              DATE: 05/02/23  Quadruped on pball - transversus abdominus activation Sitting on the pball breathing, circles Sitting shoulder press - 20x Seated bicep curl on pball 15x Shoulder ext in small squat - 7lb cable machine - 20x Pully - shoulder ext with march Pulley pallof press half kneel Backward lunge sliders - 10x each   04/14/23   Manual: lumbar fascial release Patient confirms identification and approves physical therapist to perform internal soft tissue work  - did trigger point and stretching soft tissue internally to obturator (rt side more tight) - got increased soft tissue length bilaterally  NMRE: Quadruped upper extremity reaching cues to engage core correctly Breahting and bulging - breathe into tailbone cue   Not performed today: and hamstrings, adductors Skilled palpation and techniques with dry needling  Trigger Point Dry-Needling  Treatment instructions: Expect mild to moderate muscle soreness. S/S of pneumothorax if dry needled over a  lung field, and to seek immediate medical attention should they occur. Patient verbalized understanding of these instructions and education.  Patient Consent Given: Yes Education handout provided: Yes Muscles treated: lumbar multifidi Electrical stimulation performed: No Parameters: N/A Treatment response/outcome: increased soft tissue length  NMRE Exercises: With breathing and bulging and activate transversus abdominus  Knee to chest with hips elevated Hamstring Adductors Child pose Side bend  and rotation child pose Side lying rotation Hip rotation Hooklying transversus abdominus with UE overhead - then switch to press on thigh for better results     PATIENT EDUCATION:  Education details: Access Code: [email or web address] Person educated: Patient Education method: Programmer, multimedia, Demonstration, Verbal cues, and Handouts Education comprehension: verbalized understanding and returned demonstration  HOME EXERCISE PROGRAM: Access Code: [email or web address] URL: [email or web address] Date: 05/02/2023 Prepared by: Dwana Curd  Exercises - Child's Pose with Sidebending  - 1 x daily - 7 x weekly - 1 sets - 3 reps - 30 sec hold - Cat to Child's Pose with Posterior Pelvic Tilt  - 1 x daily - 7 x weekly - 1 sets - 3 reps - 30-60 hold - Child's Pose with Thread the Needle  - 1 x daily - 7 x weekly - 1 sets - 3 reps - 30-60 hold - Supine Single Knee to Chest Stretch  - 1 x daily - 7 x weekly - 1 sets - 5 reps - 10 sec hold - Supine Transversus Abdominis Bracing - Hands on Thighs  - 1 x daily - 7 x weekly - 1 sets - 10 reps - 5 sec hold - Hip Adductors and Hamstring Stretch with Strap  - 1 x daily - 7 x weekly - 1 sets - 3 reps - 30 sec hold - Quadruped Alternating Arm Lift  - 1 x daily - 7 x weekly - 2 sets - 10 reps - Resistance Pulldown with March  - 1 x daily - 7 x weekly - 3 sets - 10 reps - Half Kneeling Anti-Rotation Press - Forward Leg Opposite Anchor Side  - 1 x daily - 7 x weekly - 3 sets - 10 reps - Reverse Lunge on Slider  - 1 x daily - 7 x weekly - 3 sets - 10 reps - Correct Seated Posture  - 1 x daily - 7 x weekly - 3 sets - 10 reps - Standing Single Arm Bicep Curls Supinated with Dumbbell  - 1 x daily - 7 x weekly - 3 sets - 10 reps  Patient Education - Trigger Point Dry Needling ASSESSMENT:  CLINICAL IMPRESSION: Pt did not do as much of exercises and pain feels worse.  Pt feels like exercises helped more than soft tissue work.  Focused on core strength and improved  functional movements with breathing.  Pt needs cues for exhale on exertion and not using midback when doing exercises.  Pt will benefit from skilled PT to continue to progress strength and reach all functional goals for pain management.  OBJECTIVE IMPAIRMENTS: decreased coordination, decreased endurance, decreased ROM, decreased strength, increased muscle spasms, impaired flexibility, impaired tone, postural dysfunction, and pain.   ACTIVITY LIMITATIONS: sitting, standing, and toileting  PARTICIPATION LIMITATIONS: community activity and occupation  PERSONAL FACTORS: Time since onset of injury/illness/exacerbation are also affecting patient's functional outcome.   REHAB POTENTIAL: Excellent  CLINICAL DECISION MAKING: Evolving/moderate complexity  EVALUATION COMPLEXITY: Moderate   GOALS: Goals reviewed with patient? Yes  SHORT TERM GOALS: Target date: 04/13/23 Updated 04/14/23  Ind with initial HEP Baseline: Goal status:  MET   LONG TERM GOALS: Target date: 06/08/23  Pt will be independent with advanced HEP to maintain improvements made throughout therapy  Baseline:  Goal status: IN PROGRESS  2.  Pt will report 75% reduction of pain due to improvements in posture, strength, and muscle length  Baseline:  Goal status: IN PROGRESS  3.  PFIQ reduced to 20 or less demonstrating improved Baseline:  Goal status: IN PROGRESS  4.  Pt will not have leakage after urinating due to improved soft tissue length Baseline:  Goal status: IN PROGRESS  5.  Pt will be able to reduce pain experienced in stressful situations or when beginning to get muscle spasms for improved pain management of no more than 1-2/10 Baseline:  Goal status: IN PROGRESS     PLAN:  PT FREQUENCY: 1x/week  PT DURATION: 12 weeks  PLANNED INTERVENTIONS: Therapeutic exercises, Therapeutic activity, Neuromuscular re-education, Balance training, Gait training, Patient/Family education, Self Care, Joint  mobilization, Dry Needling, Electrical stimulation, Cryotherapy, Moist heat, Taping, Traction, Biofeedback, Manual therapy, and Re-evaluation  PLAN FOR NEXT SESSION: breathing and keeping core engaged without ribcage protruding anteriorly; add squats and dead lift next   [employer], PT 05/16/2023, 3:30 PM

## 2023-05-17 ENCOUNTER — Ambulatory Visit: Payer: 59 | Admitting: Physical Therapy

## 2023-06-15 ENCOUNTER — Ambulatory Visit: Payer: 59 | Admitting: Urology

## 2023-08-03 ENCOUNTER — Ambulatory Visit: Payer: 59 | Admitting: Urology

## 2023-08-03 VITALS — BP 126/70 | HR 67

## 2023-08-03 DIAGNOSIS — N411 Chronic prostatitis: Secondary | ICD-10-CM

## 2023-08-03 DIAGNOSIS — R350 Frequency of micturition: Secondary | ICD-10-CM

## 2023-08-03 LAB — URINALYSIS, ROUTINE W REFLEX MICROSCOPIC
Bilirubin, UA: NEGATIVE
Glucose, UA: NEGATIVE
Ketones, UA: NEGATIVE
Leukocytes,UA: NEGATIVE
Nitrite, UA: NEGATIVE
Protein,UA: NEGATIVE
RBC, UA: NEGATIVE
Specific Gravity, UA: 1.03 (ref 1.005–1.030)
Urobilinogen, Ur: 0.2 mg/dL (ref 0.2–1.0)
pH, UA: 6 (ref 5.0–7.5)

## 2023-08-03 MED ORDER — MELOXICAM 7.5 MG PO TABS: 7.5000 mg | ORAL_TABLET | Freq: Every day | ORAL | 3 refills | Status: DC

## 2023-08-03 MED ORDER — ALFUZOSIN HCL ER 10 MG PO TB24: 10.0000 mg | ORAL_TABLET | Freq: Every day | ORAL | 11 refills | Status: DC

## 2023-08-03 MED ORDER — CYCLOBENZAPRINE HCL 5 MG PO TABS: 5.0000 mg | ORAL_TABLET | Freq: Three times a day (TID) | ORAL | 3 refills | Status: DC | PRN

## 2023-08-03 NOTE — Progress Notes (Signed)
08/03/2023 2:52 PM   Howard Oliver 04/04/1976 [id number]  Referring provider: Mechele Claude, MD [address] [place],  [place] [address]  Followup urinary frequency   HPI: Howard Oliver is a 47yo here for followup for chronic prostatitis and urinary frequency. He saw pelvic floor PT which improved his pelvic pain. He took meloxicam and flexeril which also improved his pelvic pain. IPSS 19 QOL 5. Flomax did not improve his LUTS. Nocturia 0-1x. He has intermittent straining to urinate.    PMH: No past medical history on file.  Surgical History: Past Surgical History:  Procedure Laterality Date   CORNEAL TRANSPLANT Left     Home Medications:  Allergies as of 08/03/2023   No Known Allergies      Medication List        Accurate as of August 03, 2023  2:52 PM. If you have any questions, ask your nurse or doctor.          busPIRone 10 MG tablet Commonly known as: BUSPAR Take 1 tablet (10 mg total) by mouth 2 (two) times daily.   cetirizine 10 MG tablet Commonly known as: ZYRTEC TAKE 1 TABLET BY MOUTH EVERY DAY   cyclobenzaprine 5 MG tablet Commonly known as: FLEXERIL Take 1 tablet (5 mg total) by mouth 3 (three) times daily as needed for muscle spasms.   fluticasone 50 MCG/ACT nasal spray Commonly known as: FLONASE Place 1 spray into both nostrils 2 (two) times daily as needed for allergies or rhinitis.   meloxicam 7.5 MG tablet Commonly known as: Mobic Take 1 tablet (7.5 mg total) by mouth daily.   mirabegron ER 25 MG Tb24 tablet Commonly known as: MYRBETRIQ Take 1 tablet (25 mg total) by mouth daily.   tamsulosin 0.4 MG Caps capsule Commonly known as: FLOMAX Take 1 capsule (0.4 mg total) by mouth daily after supper.        Allergies: No Known Allergies  Family History: Family History  Problem Relation Age of Onset   Cancer Mother    Cancer Father     Social History:  reports that he has been smoking e-cigarettes. He does not have any  smokeless tobacco history on file. He reports current alcohol use of about 2.0 standard drinks of alcohol per week. He reports that he does not currently use drugs.  ROS: All other review of systems were reviewed and are negative except what is noted above in HPI  Physical Exam: BP 126/70   Pulse 67   Constitutional:  Alert and oriented, No acute distress. HEENT: [place] AT, moist mucus membranes.  Trachea midline, no masses. Cardiovascular: No clubbing, cyanosis, or edema. Respiratory: Normal respiratory effort, no increased work of breathing. GI: Abdomen is soft, nontender, nondistended, no abdominal masses GU: No CVA tenderness.  Lymph: No cervical or inguinal lymphadenopathy. Skin: No rashes, bruises or suspicious lesions. Neurologic: Grossly intact, no focal deficits, moving all 4 extremities. Psychiatric: Normal mood and affect.  Laboratory Data: No results found for: "WBC", "HGB", "HCT", "MCV", "PLT"  No results found for: "CREATININE"  No results found for: "PSA"  No results found for: "TESTOSTERONE"  No results found for: "HGBA1C"  Urinalysis    Component Value Date/Time   APPEARANCEUR Clear 02/23/2023 0919   GLUCOSEU Negative 02/23/2023 0919   BILIRUBINUR Negative 02/23/2023 0919   PROTEINUR Trace 02/23/2023 0919   NITRITE Negative 02/23/2023 0919   LEUKOCYTESUR Negative 02/23/2023 0919    Lab Results  Component Value Date   LABMICR Comment 02/23/2023  WBCUA None seen 08/25/2022   LABEPIT 0-10 08/25/2022   BACTERIA Few (A) 08/25/2022    Pertinent Imaging:  No results found for this or any previous visit.  No results found for this or any previous visit.  No results found for this or any previous visit.  No results found for this or any previous visit.  No results found for this or any previous visit.  No valid procedures specified. No results found for this or any previous visit.  No results found for this or any previous visit.   Assessment &  Plan:    1. 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 Urinary frequency -uroxatral 10mg     No follow-ups on file.  Wilkie Aye, MD  [facility] [place]

## 2023-08-11 ENCOUNTER — Encounter: Payer: Self-pay | Admitting: Urology

## 2023-08-11 NOTE — Patient Instructions (Signed)

## 2023-09-14 ENCOUNTER — Ambulatory Visit: Payer: 59 | Admitting: Urology

## 2023-10-18 DIAGNOSIS — Z6825 Body mass index (BMI) 25.0-25.9, adult: Secondary | ICD-10-CM | POA: Insufficient documentation

## 2023-10-18 DIAGNOSIS — F419 Anxiety disorder, unspecified: Secondary | ICD-10-CM | POA: Insufficient documentation

## 2023-10-18 DIAGNOSIS — E291 Testicular hypofunction: Secondary | ICD-10-CM | POA: Insufficient documentation

## 2023-10-18 DIAGNOSIS — F330 Major depressive disorder, recurrent, mild: Secondary | ICD-10-CM | POA: Insufficient documentation

## 2023-10-18 NOTE — Progress Notes (Signed)
 Name: Howard Oliver DOB: September 09, 1976 MRN: [id number]  History of Present Illness: Howard Oliver is a 47 y.o. male who presents today for follow up visit at [facility] [place].  - GU history: 1. 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 - Pelvic pain improved with pelvic floor physical therapy, Mobic , and Flexeril . 2. BPH with LUTS (frequency). - Previously took Myrbetriq  25 mg.   At last visit with Howard Oliver on 08/03/2023: - Flomax  did Howard improve his LUTS. Nocturia 0-1x. He has intermittent straining to urinate. - The plan was: 1. Continue Meloxicam  7.5 and Flexeril . 2. Discontinue Flomax ; start Uroxatral  10 mg as alternative.  Today: He reports that his pelvic pain and voiding symptoms have worsened recently, possibly since he ran out of Mobic  and Flexeril . He reports weak urinary stream and intermittent dysuria. Denies urinary hesitancy or gross hematuria.   Fall Screening: Do you usually have a device to assist in your mobility? No   Medications: Current Outpatient Medications  Medication Sig Dispense Refill   alfuzosin  (UROXATRAL ) 10 MG 24 hr tablet Take 1 tablet (10 mg total) by mouth at bedtime. 30 tablet 11   cyclobenzaprine  (FLEXERIL ) 10 MG tablet Take 1 tablet by mouth nightly at bedtime. May also take every 8 hours during the day as needed. No more than 3 doses in one 24 hour period. 90 tablet 5   busPIRone  (BUSPAR ) 10 MG tablet Take 1 tablet (10 mg total) by mouth 2 (two) times daily. 180 tablet 3   cetirizine  (ZYRTEC ) 10 MG tablet TAKE 1 TABLET BY MOUTH EVERY DAY 90 tablet 1   fluticasone  (FLONASE ) 50 MCG/ACT nasal spray Place 1 spray into both nostrils 2 (two) times daily as needed for allergies or rhinitis. 16 g 6   meloxicam  (MOBIC ) 7.5 MG tablet Take 1 tablet (7.5 mg total) by mouth daily. 30 tablet 5   No current facility-administered medications for this visit.    Allergies: No Known Allergies  No past medical history on Oliver. Past Surgical  History:  Procedure Laterality Date   CORNEAL TRANSPLANT Left    Family History  Problem Relation Age of Onset   Cancer Mother    Cancer Father    Social History   Socioeconomic History   Marital status: Married    Spouse name: Howard Oliver   Number of children: Howard Oliver   Years of education: Howard Oliver   Highest education level: Howard Oliver  Occupational History   Howard Oliver  Tobacco Use   Smoking status: Some Days    Types: E-cigarettes   Smokeless tobacco: Howard Oliver  Vaping Use   Vaping status: Some Days  Substance and Sexual Activity   Alcohol use: Yes    Alcohol/week: 2.0 standard drinks of alcohol    Types: 2 Cans of beer per week   Drug use: Howard Currently   Sexual activity: Yes  Other Topics Concern   Howard Oliver  Social History Narrative   Howard Oliver   Social Drivers of Health   Financial Resource Strain: Howard Oliver  Food Insecurity: Howard Oliver  Transportation Needs: Howard Oliver  Physical Activity: Howard Oliver  Stress: Howard Oliver  Social Connections: Howard Oliver  Intimate Partner Violence: Howard Oliver    Review of Systems Constitutional: Patient denies any unintentional weight loss or change in strength lntegumentary: Patient denies any rashes or pruritus Cardiovascular: Patient denies chest pain or syncope Respiratory: Patient denies  shortness of breath Gastrointestinal: Patient denies nausea, vomiting, constipation, or diarrhea Musculoskeletal: Patient denies muscle cramps or weakness Neurologic: Patient denies convulsions or seizures Allergic/Immunologic: Patient denies recent allergic reaction(s) Hematologic/Lymphatic: Patient denies bleeding tendencies Endocrine: Patient denies heat/cold intolerance  GU: As per HPI.  OBJECTIVE Vitals:   10/25/23 1528  BP: 118/78  Pulse: 68  Temp: 98 F (36.7 C)   There is no height or weight on Oliver to calculate BMI.  Physical Examination Constitutional: No obvious distress; patient is  non-toxic appearing  Cardiovascular: No visible lower extremity edema.  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 - Plan: Urinalysis, Routine w reflex microscopic, cyclobenzaprine  (FLEXERIL ) 10 MG tablet, meloxicam  (MOBIC ) 7.5 MG tablet, CANCELED: BLADDER SCAN AMB NON-IMAGING  Urinary frequency - Plan: Urinalysis, Routine w reflex microscopic, BLADDER SCAN AMB NON-IMAGING, CANCELED: BLADDER SCAN AMB NON-IMAGING  BPH with obstruction/lower urinary tract symptoms - Plan: Urinalysis, Routine w reflex microscopic, CANCELED: BLADDER SCAN AMB NON-IMAGING  High-tone pelvic floor dysfunction - Plan: cyclobenzaprine  (FLEXERIL ) 10 MG tablet, meloxicam  (MOBIC ) 7.5 MG tablet  Voiding dysfunction - Plan: cyclobenzaprine  (FLEXERIL ) 10 MG tablet, meloxicam  (MOBIC ) 7.5 MG tablet  Pelvic pain in male - Plan: cyclobenzaprine  (FLEXERIL ) 10 MG tablet, meloxicam  (MOBIC ) 7.5 MG tablet  Anxiety about health  We reviewed CPPS and pelvic floor muscle dysfunction including potential triggers / contributing factors and how that can contribute to pelvic pain / voiding dysfunction. We agreed to start Flexeril  10 mg nightly for muscle relaxation and to restart Mobic  7.5 mg daily to minimize inflammation. Will also continue Uroxatral  also. He was encouraged to do the techniques he previously learned at pelvic floor physical therapy and to continue addressing his mental health / chronic anxiety with his PCP, counseling, etc. We discussed that if symptoms fail to improve the next step may be  trial of a neuropathic medication such as Amitriptyline.  He was reassured that there has been no evidence to suggest malignancy, which he was fearful about. Offered PSA check if desired; he denied any known family history of prostate cancer and therefore we agreed to hold off on PSA for now.  Will plan for follow up in 3-4 weeks for symptom check. Pt verbalized understanding and agreement. All questions were answered.  PLAN Advised the following: 1. Continue Uroxatral  10 mg nightly. 2. Flexeril  10 mg nightly and every 8 hours PRN during the day. 3. Mobic  7.5 mg daily. 4. At-home pelvic floor PT exercises / relaxation / stretching. 5. Return in about 3 weeks (around 11/15/2023).  Orders Placed This Encounter  Procedures   Urinalysis, Routine w reflex microscopic   BLADDER SCAN AMB NON-IMAGING   Total time spent caring for the patient today was over 30 minutes. This includes time spent on the date of the visit reviewing the patient's chart before the visit, time spent during the visit, and time spent after the visit on documentation. Over 50% of that time was spent in face-to-face time with this patient for direct counseling. E&M based on time and complexity of medical decision making.  It has been explained that the patient is to follow regularly with their PCP in addition to all other  providers involved in their care and to follow instructions provided by these respective offices. Patient advised to contact urology clinic if any urologic-pertaining questions, concerns, new symptoms or problems arise in the interim period.  There are no Patient Instructions on Oliver for this visit.  Electronically signed by:  Lauraine JAYSON Oz, FNP   10/25/23    4:05 PM

## 2023-10-25 ENCOUNTER — Ambulatory Visit: Payer: 59 | Admitting: Urology

## 2023-10-25 ENCOUNTER — Encounter: Payer: Self-pay | Admitting: Urology

## 2023-10-25 VITALS — BP 118/78 | HR 68 | Temp 98.0°F

## 2023-10-25 DIAGNOSIS — R350 Frequency of micturition: Secondary | ICD-10-CM

## 2023-10-25 DIAGNOSIS — N401 Enlarged prostate with lower urinary tract symptoms: Secondary | ICD-10-CM | POA: Diagnosis not present

## 2023-10-25 DIAGNOSIS — M6289 Other specified disorders of muscle: Secondary | ICD-10-CM | POA: Diagnosis not present

## 2023-10-25 DIAGNOSIS — R4589 Other symptoms and signs involving emotional state: Secondary | ICD-10-CM

## 2023-10-25 DIAGNOSIS — N411 Chronic prostatitis: Secondary | ICD-10-CM | POA: Diagnosis not present

## 2023-10-25 DIAGNOSIS — R102 Pelvic and perineal pain: Secondary | ICD-10-CM | POA: Insufficient documentation

## 2023-10-25 DIAGNOSIS — N138 Other obstructive and reflux uropathy: Secondary | ICD-10-CM

## 2023-10-25 DIAGNOSIS — N398 Other specified disorders of urinary system: Secondary | ICD-10-CM

## 2023-10-25 MED ORDER — MELOXICAM 7.5 MG PO TABS: 7.5000 mg | ORAL_TABLET | Freq: Every day | ORAL | 5 refills | Status: DC

## 2023-10-25 MED ORDER — CYCLOBENZAPRINE HCL 10 MG PO TABS: ORAL_TABLET | ORAL | 5 refills | Status: DC

## 2023-10-25 NOTE — Progress Notes (Signed)
post void residual=13 

## 2023-10-26 LAB — URINALYSIS, ROUTINE W REFLEX MICROSCOPIC
Bilirubin, UA: NEGATIVE
Glucose, UA: NEGATIVE
Ketones, UA: NEGATIVE
Leukocytes,UA: NEGATIVE
Nitrite, UA: NEGATIVE
Protein,UA: NEGATIVE
RBC, UA: NEGATIVE
Specific Gravity, UA: 1.03 (ref 1.005–1.030)
Urobilinogen, Ur: 1 mg/dL (ref 0.2–1.0)
pH, UA: 6 (ref 5.0–7.5)

## 2023-11-09 NOTE — Progress Notes (Signed)
Name: Howard Oliver DOB: September 13, 1976 MRN: [id number]  History of Present Illness: Howard Oliver is a 48 y.o. male who presents today for follow up visit at [facility] [place]. - GU history: 1. 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 - Pelvic pain improved with pelvic floor physical therapy, Mobic, and Flexeril. 2. BPH with LUTS (frequency, nocturia x0-1, intermittent straining to urinate). - Previously took Myrbetriq 25 mg.  - Previously took Flomax (not helpful). - Taking Uroxatral 10 mg nightly.    At last visit on 10/25/2023: - Reported recently worsened pelvic pain and voiding symptoms ((weak urinary stream and intermittent dysuria), possibly due to running out of Mobic and Flexeril. - Negative UA. - PVR: 13 ml. - Advised the following: 1. Continue Uroxatral 10 mg nightly. 2. Flexeril 10 mg nightly and every 8 hours PRN during the day. 3. Mobic 7.5 mg daily. 4. Continue at-home pelvic floor PT exercises / relaxation / stretching. 5. Return in about 3 weeks (around 11/15/2023).  Today: He reports that his pelvic pressure has improved since last visit. Still having urinary urgency, frequency, and intermittent dysuria which is described as moderate in intensity. Also continues to have a weak urinary stream and hesitancy Denies gross hematuria, straining to void, or sensations of incomplete emptying.   Fall Screening: Do you usually have a device to assist in your mobility? No   Medications: Current Outpatient Medications  Medication Sig Dispense Refill   alfuzosin (UROXATRAL) 10 MG 24 hr tablet Take 1 tablet (10 mg total) by mouth at bedtime. 30 tablet 11   busPIRone (BUSPAR) 10 MG tablet Take 1 tablet (10 mg total) by mouth 2 (two) times daily. 180 tablet 3   cetirizine (ZYRTEC) 10 MG tablet TAKE 1 TABLET BY MOUTH EVERY DAY 90 tablet 1   cyclobenzaprine (FLEXERIL) 10 MG tablet Take 1 tablet by mouth nightly at bedtime. May also take every 8 hours during the day as  needed. No more than 3 doses in one 24 hour period. 90 tablet 5   fluticasone (FLONASE) 50 MCG/ACT nasal spray Place 1 spray into both nostrils 2 (two) times daily as needed for allergies or rhinitis. 16 g 6   meloxicam (MOBIC) 7.5 MG tablet Take 1 tablet (7.5 mg total) by mouth daily. 30 tablet 5   No current facility-administered medications for this visit.    Allergies: No Known Allergies  History reviewed. No pertinent past medical history. Past Surgical History:  Procedure Laterality Date   CORNEAL TRANSPLANT Left    Family History  Problem Relation Age of Onset   Cancer Mother    Cancer Father    Social History   Socioeconomic History   Marital status: Married    Spouse name: Not on file   Number of children: Not on file   Years of education: Not on file   Highest education level: Not on file  Occupational History   Not on file  Tobacco Use   Smoking status: Some Days    Types: E-cigarettes   Smokeless tobacco: Not on file  Vaping Use   Vaping status: Some Days  Substance and Sexual Activity   Alcohol use: Yes    Alcohol/week: 2.0 standard drinks of alcohol    Types: 2 Cans of beer per week   Drug use: Not Currently   Sexual activity: Yes  Other Topics Concern   Not on file  Social History Narrative   Not on file   Social Drivers of Health   Financial Resource Strain:  Not on file  Food Insecurity: Not on file  Transportation Needs: Not on file  Physical Activity: Not on file  Stress: Not on file  Social Connections: Not on file  Intimate Partner Violence: Not on file    Review of Systems Constitutional: Patient denies any unintentional weight loss or change in strength lntegumentary: Patient denies any rashes or pruritus Cardiovascular: Patient denies chest pain or syncope Respiratory: Patient denies shortness of breath Gastrointestinal: Patient denies nausea, vomiting, constipation, or diarrhea Musculoskeletal: Patient denies muscle cramps or  weakness Neurologic: Patient denies convulsions or seizures Allergic/Immunologic: Patient denies recent allergic reaction(s) Hematologic/Lymphatic: Patient denies bleeding tendencies Endocrine: Patient denies heat/cold intolerance  GU: As per HPI.  OBJECTIVE Vitals:   11/18/23 0838  BP: 124/80  Pulse: 65  Temp: 98 F (36.7 C)   There is no height or weight on file to calculate BMI.  Physical Examination Constitutional: No obvious distress; patient is non-toxic appearing  Cardiovascular: No visible lower extremity edema.  Respiratory: The patient does not have audible wheezing/stridor; respirations do not appear labored  Gastrointestinal: Abdomen non-distended Musculoskeletal: Normal ROM of UEs  Skin: No obvious rashes/open sores  Neurologic: CN 2-12 grossly intact Psychiatric: Answered questions appropriately with normal affect  Hematologic/Lymphatic/Immunologic: No obvious bruises or sites of spontaneous bleeding  UA: negative PVR: 17 ml  ASSESSMENT Chronic prostatitis without hematuria - Plan: Urinalysis, Routine w reflex microscopic, BLADDER SCAN AMB NON-IMAGING  Urinary frequency - Plan: Urinalysis, Routine w reflex microscopic, BLADDER SCAN AMB NON-IMAGING  High-tone pelvic floor dysfunction - Plan: Urinalysis, Routine w reflex microscopic, BLADDER SCAN AMB NON-IMAGING  Voiding dysfunction - Plan: Urinalysis, Routine w reflex microscopic, BLADDER SCAN AMB NON-IMAGING  Pelvic pain in male - Plan: Urinalysis, Routine w reflex microscopic, BLADDER SCAN AMB NON-IMAGING  Symptoms are improved since last visit and manageable at this time per patient. Will continue current treatment regimen and plan for follow up in 5-6 months or sooner if needed. Patient verbalized understanding of and agreement with current plan. All questions were answered.  PLAN Advised the following: 1. Continue Uroxatral 10 mg nightly. 2. Continue Flexeril 10 mg nightly and every 8 hours PRN during  the day. 3. Continue Mobic 7.5 mg daily. 4. Continue at-home pelvic floor PT exercises / relaxation / stretching. 5. Return in about 5 months (around 04/17/2024) for UA, PVR, & f/u with Howard Georges NP.  Orders Placed This Encounter  Procedures   Urinalysis, Routine w reflex microscopic   BLADDER SCAN AMB NON-IMAGING    It has been explained that the patient is to follow regularly with their PCP in addition to all other providers involved in their care and to follow instructions provided by these respective offices. Patient advised to contact urology clinic if any urologic-pertaining questions, concerns, new symptoms or problems arise in the interim period.  There are no Patient Instructions on file for this visit.  Electronically signed by:  Donnita Falls, FNP   11/18/23    9:01 AM

## 2023-11-18 ENCOUNTER — Ambulatory Visit: Payer: 59 | Admitting: Urology

## 2023-11-18 ENCOUNTER — Encounter: Payer: Self-pay | Admitting: Urology

## 2023-11-18 VITALS — BP 124/80 | HR 65 | Temp 98.0°F

## 2023-11-18 DIAGNOSIS — R350 Frequency of micturition: Secondary | ICD-10-CM

## 2023-11-18 DIAGNOSIS — R102 Pelvic and perineal pain: Secondary | ICD-10-CM

## 2023-11-18 DIAGNOSIS — N411 Chronic prostatitis: Secondary | ICD-10-CM

## 2023-11-18 DIAGNOSIS — N398 Other specified disorders of urinary system: Secondary | ICD-10-CM

## 2023-11-18 DIAGNOSIS — M6289 Other specified disorders of muscle: Secondary | ICD-10-CM

## 2023-11-18 LAB — URINALYSIS, ROUTINE W REFLEX MICROSCOPIC
Bilirubin, UA: NEGATIVE
Glucose, UA: NEGATIVE
Ketones, UA: NEGATIVE
Leukocytes,UA: NEGATIVE
Nitrite, UA: NEGATIVE
Protein,UA: NEGATIVE
RBC, UA: NEGATIVE
Specific Gravity, UA: 1.03 (ref 1.005–1.030)
Urobilinogen, Ur: 0.2 mg/dL (ref 0.2–1.0)
pH, UA: 5.5 (ref 5.0–7.5)

## 2023-11-18 LAB — BLADDER SCAN AMB NON-IMAGING: Scan Result: 17

## 2024-03-21 IMAGING — DX DG CHEST 1V
1 series · 1 of 1 positions shown · non-contrast
Comparison: December 10, 2015

CLINICAL DATA: Employment chest x-ray clearance

EXAM:
CHEST  1 VIEW

[dg chest 1 view]
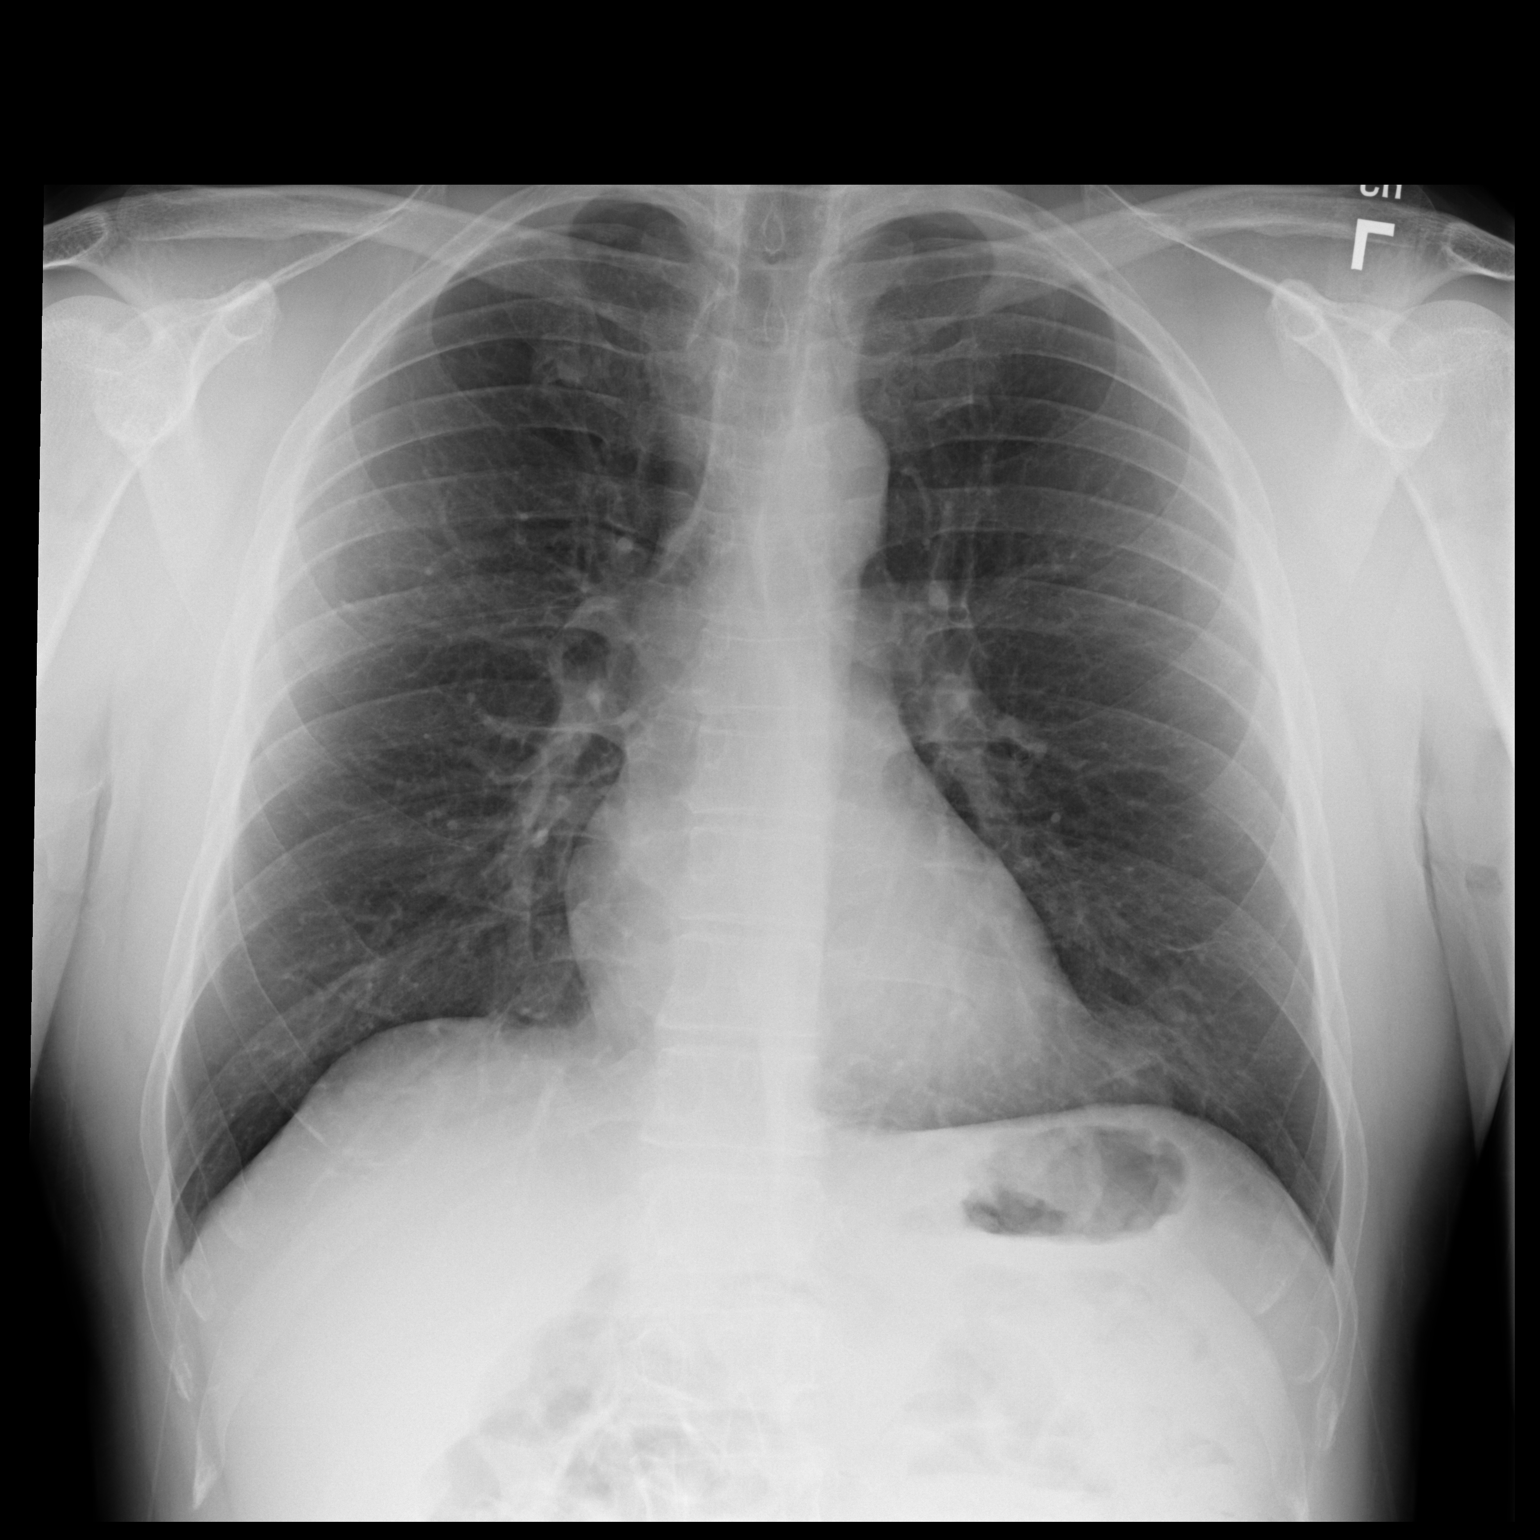

[1 of 1 positions shown; findings below may reference images not displayed]

FINDINGS: The heart size and mediastinal contours are within normal limits.
Both lungs are clear. The visualized skeletal structures are
unremarkable.
IMPRESSION: No active disease.

## 2024-04-16 NOTE — Progress Notes (Deleted)
 Name: Howard Oliver DOB: 11-16-1975 MRN: [id number]  History of Present Illness: Howard Oliver is a 48 y.o. male who presents today for follow up visit at [facility] [place].  Relevant History includes: 1. Chronic prostatitis with pelvic pain. - Improved with pelvic floor physical therapy, Mobic , and Flexeril . 2. BPH with LUTS (frequency, nocturia x0-1, intermittent straining to urinate). - Previously took Myrbetriq  25 mg.  - Previously took Flomax  (not helpful).   At last visit on 11/18/2023: - Pelvic pressure improved; LUTS manageable per patient.  - The plan was: 1. Continue Uroxatral  10 mg nightly. 2. Continue Flexeril  10 mg nightly and every 8 hours PRN during the day. 3. Continue Mobic  7.5 mg daily. 4. Continue at-home pelvic floor PT exercises / relaxation / stretching. 5. Return in about 5 months (around 04/17/2024) for UA, PVR, & f/u with Lauraine Oz NP.  Since last visit: ***  Today: He reports ***  He {Actions; denies-reports:120008} increased urinary urgency, frequency, nocturia, dysuria, gross hematuria, hesitancy, straining to void, or sensations of incomplete emptying.   Medications: Current Outpatient Medications  Medication Sig Dispense Refill   alfuzosin  (UROXATRAL ) 10 MG 24 hr tablet Take 1 tablet (10 mg total) by mouth at bedtime. 30 tablet 11   busPIRone  (BUSPAR ) 10 MG tablet Take 1 tablet (10 mg total) by mouth 2 (two) times daily. 180 tablet 3   cetirizine  (ZYRTEC ) 10 MG tablet TAKE 1 TABLET BY MOUTH EVERY DAY 90 tablet 1   cyclobenzaprine  (FLEXERIL ) 10 MG tablet Take 1 tablet by mouth nightly at bedtime. May also take every 8 hours during the day as needed. No more than 3 doses in one 24 hour period. 90 tablet 5   fluticasone  (FLONASE ) 50 MCG/ACT nasal spray Place 1 spray into both nostrils 2 (two) times daily as needed for allergies or rhinitis. 16 g 6   meloxicam  (MOBIC ) 7.5 MG tablet Take 1 tablet (7.5 mg total) by mouth daily. 30  tablet 5   No current facility-administered medications for this visit.    Allergies: No Known Allergies  No past medical history on file. Past Surgical History:  Procedure Laterality Date   CORNEAL TRANSPLANT Left    Family History  Problem Relation Age of Onset   Cancer Mother    Cancer Father    Social History   Socioeconomic History   Marital status: Married    Spouse name: Not on file   Number of children: Not on file   Years of education: Not on file   Highest education level: Not on file  Occupational History   Not on file  Tobacco Use   Smoking status: Some Days    Types: E-cigarettes   Smokeless tobacco: Not on file  Vaping Use   Vaping status: Some Days  Substance and Sexual Activity   Alcohol use: Yes    Alcohol/week: 2.0 standard drinks of alcohol    Types: 2 Cans of beer per week   Drug use: Not Currently   Sexual activity: Yes  Other Topics Concern   Not on file  Social History Narrative   Not on file   Social Drivers of Health   Financial Resource Strain: Not on file  Food Insecurity: Not on file  Transportation Needs: Not on file  Physical Activity: Not on file  Stress: Not on file  Social Connections: Not on file  Intimate Partner Violence: Not on file    Review of Systems Constitutional: Patient denies any unintentional weight loss or  change in strength lntegumentary: Patient denies any rashes or pruritus Cardiovascular: Patient denies chest pain or syncope Respiratory: Patient denies shortness of breath Gastrointestinal: ***Patient denies nausea, vomiting, constipation, or diarrhea ***As per HPI Musculoskeletal: Patient denies muscle cramps or weakness Neurologic: Patient denies convulsions or seizures Allergic/Immunologic: Patient denies recent allergic reaction(s) Hematologic/Lymphatic: Patient denies bleeding tendencies Endocrine: Patient denies heat/cold intolerance  GU: As per HPI.  OBJECTIVE There were no vitals filed for  this visit. There is no height or weight on file to calculate BMI.  Physical Examination Constitutional: No obvious distress; patient is non-toxic appearing  Cardiovascular: No visible lower extremity edema.  Respiratory: The patient does not have audible wheezing/stridor; respirations do not appear labored  Gastrointestinal: Abdomen non-distended Musculoskeletal: Normal ROM of UEs  Skin: No obvious rashes/open sores  Neurologic: CN 2-12 grossly intact Psychiatric: Answered questions appropriately with normal affect  Hematologic/Lymphatic/Immunologic: No obvious bruises or sites of spontaneous bleeding  UA: ***negative ***positive for *** leukocytes, *** blood, ***nitrites Urine microscopy: *** WBC/hpf, *** RBC/hpf, *** bacteria ***glucosuria (secondary to ***Jardiance ***Farxiga use) ***otherwise unremarkable  PVR: *** ml  ASSESSMENT No diagnosis found. ***  We agreed to plan for follow up in *** months / ***1 year or sooner if needed. Patient verbalized understanding of and agreement with current plan. All questions were answered.  PLAN Advised the following: 1. *** 2. ***No follow-ups on file.  No orders of the defined types were placed in this encounter.   It has been explained that the patient is to follow regularly with their PCP in addition to all other providers involved in their care and to follow instructions provided by these respective offices. Patient advised to contact urology clinic if any urologic-pertaining questions, concerns, new symptoms or problems arise in the interim period.  There are no Patient Instructions on file for this visit.  Electronically signed by:  Lauraine JAYSON Oz, FNP   04/16/24    12:58 PM

## 2024-04-17 ENCOUNTER — Ambulatory Visit: Payer: 59 | Admitting: Urology

## 2024-04-30 ENCOUNTER — Ambulatory Visit: Admitting: Urology

## 2024-04-30 ENCOUNTER — Encounter: Payer: Self-pay | Admitting: Urology

## 2024-04-30 VITALS — BP 111/76 | HR 76

## 2024-04-30 DIAGNOSIS — R102 Pelvic and perineal pain: Secondary | ICD-10-CM

## 2024-04-30 DIAGNOSIS — N401 Enlarged prostate with lower urinary tract symptoms: Secondary | ICD-10-CM | POA: Diagnosis not present

## 2024-04-30 DIAGNOSIS — N411 Chronic prostatitis: Secondary | ICD-10-CM

## 2024-04-30 DIAGNOSIS — M6289 Other specified disorders of muscle: Secondary | ICD-10-CM

## 2024-04-30 DIAGNOSIS — Z125 Encounter for screening for malignant neoplasm of prostate: Secondary | ICD-10-CM

## 2024-04-30 DIAGNOSIS — N398 Other specified disorders of urinary system: Secondary | ICD-10-CM | POA: Diagnosis not present

## 2024-04-30 DIAGNOSIS — N138 Other obstructive and reflux uropathy: Secondary | ICD-10-CM

## 2024-04-30 LAB — URINALYSIS, ROUTINE W REFLEX MICROSCOPIC
Bilirubin, UA: NEGATIVE
Glucose, UA: NEGATIVE
Ketones, UA: NEGATIVE
Leukocytes,UA: NEGATIVE
Nitrite, UA: NEGATIVE
Protein,UA: NEGATIVE
RBC, UA: NEGATIVE
Specific Gravity, UA: 1.03 (ref 1.005–1.030)
Urobilinogen, Ur: 0.2 mg/dL (ref 0.2–1.0)
pH, UA: 6 (ref 5.0–7.5)

## 2024-04-30 LAB — BLADDER SCAN AMB NON-IMAGING: Scan Result: 5

## 2024-04-30 MED ORDER — CYCLOBENZAPRINE HCL 10 MG PO TABS
ORAL_TABLET | ORAL | 11 refills | Status: AC
Start: 2024-04-30 — End: ?

## 2024-04-30 MED ORDER — ALFUZOSIN HCL ER 10 MG PO TB24: 10.0000 mg | ORAL_TABLET | Freq: Every day | ORAL | 11 refills | Status: AC

## 2024-04-30 MED ORDER — MELOXICAM 7.5 MG PO TABS: 7.5000 mg | ORAL_TABLET | Freq: Every day | ORAL | 11 refills | Status: AC

## 2024-04-30 NOTE — Progress Notes (Signed)
 Name: Howard Oliver DOB: 11-23-1975 MRN: [id number]  History of Present Illness: Howard Oliver is a 48 y.o. male who presents today for follow up visit at [facility] [place].  Relevant History includes: 1. Chronic prostatitis with pelvic pain. - Improved with pelvic floor physical therapy, Mobic , and Flexeril . 2. BPH with LUTS (frequency, nocturia x0-1, intermittent straining to urinate). - Previously took Myrbetriq  25 mg.  - Previously took Flomax  (not helpful). - Denies family history of prostate cancer.   At last visit on 11/18/2023: - Pelvic pressure improved; LUTS manageable per patient.  - The plan was: 1. Continue Uroxatral  10 mg nightly. 2. Continue Flexeril  10 mg nightly and every 8 hours PRN during the day. 3. Continue Mobic  7.5 mg daily. 4. Continue at-home pelvic floor PT exercises / relaxation / stretching. 5. Return in about 5 months (around 04/17/2024) for UA, PVR, & f/u with Howard Oz NP.  Today: He reports that over the past 3 weeks has been having a flare up with increased low midline / suprapubic pain and burning pain in his scrotum. He reports increased urinary frequency, occasional mild dysuria, intermittent straining to void. Reports solid urinary stream. Denies urinary urgency, gross hematuria, hesitancy, or sensations of incomplete emptying.   He ran out of his medications several months ago.   Denies constipation or pain with defecation.   Reports life stressors.   Medications: Current Outpatient Medications  Medication Sig Dispense Refill   busPIRone  (BUSPAR ) 10 MG tablet Take 1 tablet (10 mg total) by mouth 2 (two) times daily. 180 tablet 3   alfuzosin  (UROXATRAL ) 10 MG 24 hr tablet Take 1 tablet (10 mg total) by mouth at bedtime. 30 tablet 11   cetirizine  (ZYRTEC ) 10 MG tablet TAKE 1 TABLET BY MOUTH EVERY DAY (Patient not taking: Reported on 04/30/2024) 90 tablet 1   cyclobenzaprine  (FLEXERIL ) 10 MG tablet Take 1 tablet by mouth  nightly at bedtime. May also take every 8 hours during the day as needed. No more than 3 doses in one 24 hour period. 90 tablet 11   fluticasone  (FLONASE ) 50 MCG/ACT nasal spray Place 1 spray into both nostrils 2 (two) times daily as needed for allergies or rhinitis. (Patient not taking: Reported on 04/30/2024) 16 g 6   meloxicam  (MOBIC ) 7.5 MG tablet Take 1 tablet (7.5 mg total) by mouth daily. 30 tablet 11   No current facility-administered medications for this visit.    Allergies: No Known Allergies  History reviewed. No pertinent past medical history. Past Surgical History:  Procedure Laterality Date   CORNEAL TRANSPLANT Left    Family History  Problem Relation Age of Onset   Cancer Mother    Cancer Father    Social History   Socioeconomic History   Marital status: Married    Spouse name: Not on file   Number of children: Not on file   Years of education: Not on file   Highest education level: Not on file  Occupational History   Not on file  Tobacco Use   Smoking status: Some Days    Types: E-cigarettes   Smokeless tobacco: Not on file  Vaping Use   Vaping status: Some Days  Substance and Sexual Activity   Alcohol use: Yes    Alcohol/week: 2.0 standard drinks of alcohol    Types: 2 Cans of beer per week   Drug use: Not Currently   Sexual activity: Yes  Other Topics Concern   Not on file  Social History Narrative  Not on file   Social Drivers of Health   Financial Resource Strain: Not on file  Food Insecurity: Not on file  Transportation Needs: Not on file  Physical Activity: Not on file  Stress: Not on file  Social Connections: Not on file  Intimate Partner Violence: Not on file    Review of Systems Constitutional: Patient denies any unintentional weight loss or change in strength lntegumentary: Patient denies any rashes or pruritus Cardiovascular: Patient denies chest pain or syncope Respiratory: Patient denies shortness of breath Gastrointestinal:  Patient denies nausea, vomiting, constipation, or diarrhea  Musculoskeletal: Patient denies muscle cramps or weakness Neurologic: Patient denies convulsions or seizures Allergic/Immunologic: Patient denies recent allergic reaction(s) Hematologic/Lymphatic: Patient denies bleeding tendencies Endocrine: Patient denies heat/cold intolerance  GU: As per HPI.  OBJECTIVE Vitals:   04/30/24 1410  BP: 111/76  Pulse: 76   There is no height or weight on file to calculate BMI.  Physical Examination Constitutional: No obvious distress; patient is non-toxic appearing  Cardiovascular: No visible lower extremity edema.  Respiratory: The patient does not have audible wheezing/stridor; respirations do not appear labored  Gastrointestinal: Abdomen non-distended Musculoskeletal: Normal ROM of UEs  Skin: No obvious rashes/open sores  Neurologic: CN 2-12 grossly intact Psychiatric: Answered questions appropriately with normal affect  Hematologic/Lymphatic/Immunologic: No obvious bruises or sites of spontaneous bleeding  PVR: 5 ml  ASSESSMENT Chronic prostatitis without hematuria - Plan: BLADDER SCAN AMB NON-IMAGING, Urinalysis, Routine w reflex microscopic, cyclobenzaprine  (FLEXERIL ) 10 MG tablet, meloxicam  (MOBIC ) 7.5 MG tablet, PSA  BPH with obstruction/lower urinary tract symptoms - Plan: BLADDER SCAN AMB NON-IMAGING, Urinalysis, Routine w reflex microscopic, alfuzosin  (UROXATRAL ) 10 MG 24 hr tablet, PSA  Voiding dysfunction - Plan: BLADDER SCAN AMB NON-IMAGING, Urinalysis, Routine w reflex microscopic, cyclobenzaprine  (FLEXERIL ) 10 MG tablet, meloxicam  (MOBIC ) 7.5 MG tablet  High-tone pelvic floor dysfunction - Plan: BLADDER SCAN AMB NON-IMAGING, Urinalysis, Routine w reflex microscopic, cyclobenzaprine  (FLEXERIL ) 10 MG tablet, meloxicam  (MOBIC ) 7.5 MG tablet  Pelvic pain in male - Plan: cyclobenzaprine  (FLEXERIL ) 10 MG tablet, meloxicam  (MOBIC ) 7.5 MG tablet  Prostate cancer screening -  Plan: PSA  We discussed that being off his medications + stress are likely cause for his increased symptoms. We agreed to restart Uroxatral , Flexeril , and Mobic  as those were helpful previously. We agreed to plan for follow up in 8 weeks for symptom recheck.   We also agreed to check PSA today for prostate cancer screening per patient request. He was counseled in detail about his elevated serum PSA value, which is a protein produced in normal and neoplastic cells. Age norms and PSA velocity (trend) were discussed.   Patient verbalized understanding of and agreement with current plan. All questions were answered.  PLAN Advised the following: 1. PSA today. 2. Uroxatral  10 mg nightly. 3. Flexeril  10 mg nightly and every 8 hours PRN during the day. 4. Mobic  7.5 mg daily. 5. Continue at-home pelvic floor PT exercises / relaxation / stretching. 6. Return in about 8 weeks (around 06/25/2024) for BPH and CPPS, with UA & PVR.  Orders Placed This Encounter  Procedures   Urinalysis, Routine w reflex microscopic   PSA   BLADDER SCAN AMB NON-IMAGING    It has been explained that the patient is to follow regularly with their PCP in addition to all other providers involved in their care and to follow instructions provided by these respective offices. Patient advised to contact urology clinic if any urologic-pertaining questions, concerns, new symptoms or problems arise in  the interim period.  There are no Patient Instructions on file for this visit.  Electronically signed by:  Howard JAYSON Oz, FNP   04/30/24    2:44 PM

## 2024-05-01 ENCOUNTER — Ambulatory Visit: Payer: Self-pay | Admitting: Urology

## 2024-05-01 LAB — PSA: Prostate Specific Ag, Serum: 0.3 ng/mL (ref 0.0–4.0)

## 2024-06-25 ENCOUNTER — Ambulatory Visit: Admitting: Urology

## 2024-07-09 ENCOUNTER — Ambulatory Visit: Admitting: Urology

## 2024-07-09 DIAGNOSIS — N411 Chronic prostatitis: Secondary | ICD-10-CM

## 2024-08-13 NOTE — Progress Notes (Signed)
   H&P  Chief Complaint: ***  History of Present Illness: ***  No past medical history on file.  Past Surgical History:  Procedure Laterality Date   CORNEAL TRANSPLANT Left     Home Medications:  Allergies as of 08/14/2024   No Known Allergies      Medication List        Accurate as of August 13, 2024  3:00 PM. If you have any questions, ask your nurse or doctor.          alfuzosin  10 MG 24 hr tablet Commonly known as: UROXATRAL  Take 1 tablet (10 mg total) by mouth at bedtime.   busPIRone  10 MG tablet Commonly known as: BUSPAR  Take 1 tablet (10 mg total) by mouth 2 (two) times daily.   cetirizine  10 MG tablet Commonly known as: ZYRTEC  TAKE 1 TABLET BY MOUTH EVERY DAY   cyclobenzaprine  10 MG tablet Commonly known as: FLEXERIL  Take 1 tablet by mouth nightly at bedtime. May also take every 8 hours during the day as needed. No more than 3 doses in one 24 hour period.   fluticasone  50 MCG/ACT nasal spray Commonly known as: FLONASE  Place 1 spray into both nostrils 2 (two) times daily as needed for allergies or rhinitis.   meloxicam  7.5 MG tablet Commonly known as: Mobic  Take 1 tablet (7.5 mg total) by mouth daily.        Allergies: No Known Allergies  Family History  Problem Relation Age of Onset   Cancer Mother    Cancer Father     Social History:  reports that he has been smoking e-cigarettes. He does not have any smokeless tobacco history on file. He reports current alcohol use of about 2.0 standard drinks of alcohol per week. He reports that he does not currently use drugs.  ROS: A complete review of systems was performed.  All systems are negative except for pertinent findings as noted.  Physical Exam:  Vital signs in last 24 hours: There were no vitals taken for this visit. Constitutional:  Alert and oriented, No acute distress Cardiovascular: Regular rate  Respiratory: Normal respiratory effort GI: Abdomen is soft, nontender,  nondistended, no abdominal masses. No CVAT.  Genitourinary: Normal male phallus, testes are descended bilaterally and non-tender and without masses, scrotum is normal in appearance without lesions or masses, perineum is normal on inspection. Lymphatic: No lymphadenopathy Neurologic: Grossly intact, no focal deficits Psychiatric: Normal mood and affect  I have reviewed prior pt notes  I have reviewed notes from referring/previous physicians  I have reviewed urinalysis results  I have independently reviewed prior imaging  I have reviewed prior PSA results  I have reviewed prior urine culture   Impression/Assessment:  ***  Plan:  ***

## 2024-08-14 ENCOUNTER — Ambulatory Visit: Admitting: Urology

## 2024-08-14 VITALS — BP 119/76 | HR 79

## 2024-08-14 DIAGNOSIS — R102 Pelvic and perineal pain unspecified side: Secondary | ICD-10-CM | POA: Diagnosis not present

## 2024-08-14 DIAGNOSIS — Z125 Encounter for screening for malignant neoplasm of prostate: Secondary | ICD-10-CM

## 2024-08-14 DIAGNOSIS — R350 Frequency of micturition: Secondary | ICD-10-CM

## 2024-08-14 DIAGNOSIS — N411 Chronic prostatitis: Secondary | ICD-10-CM

## 2024-08-14 DIAGNOSIS — N398 Other specified disorders of urinary system: Secondary | ICD-10-CM

## 2024-08-14 DIAGNOSIS — J302 Other seasonal allergic rhinitis: Secondary | ICD-10-CM

## 2024-08-14 DIAGNOSIS — N401 Enlarged prostate with lower urinary tract symptoms: Secondary | ICD-10-CM

## 2024-08-14 DIAGNOSIS — F419 Anxiety disorder, unspecified: Secondary | ICD-10-CM | POA: Diagnosis not present

## 2024-08-14 LAB — URINALYSIS, ROUTINE W REFLEX MICROSCOPIC
Bilirubin, UA: NEGATIVE
Glucose, UA: NEGATIVE
Ketones, UA: NEGATIVE
Leukocytes,UA: NEGATIVE
Nitrite, UA: NEGATIVE
Protein,UA: NEGATIVE
RBC, UA: NEGATIVE
Specific Gravity, UA: <1.005 — ABNORMAL LOW (ref 1.005–1.030)
Urobilinogen, Ur: 0.2 mg/dL (ref 0.2–1.0)
pH, UA: 6 (ref 5.0–7.5)

## 2025-02-04 ENCOUNTER — Ambulatory Visit: Admitting: Urology
